# Patient Record
Sex: Male | Born: 1955 | Race: White | Hispanic: No | Marital: Single | Smoking: Never smoker
Health system: Southern US, Community
[De-identification: ages and names within clinical notes are randomized; demographics above are authoritative.]

## PROBLEM LIST (undated history)

## (undated) HISTORY — PX: BICEPS TENDON REPAIR: SHX566

## (undated) HISTORY — PX: ANTERIOR CRUCIATE LIGAMENT REPAIR: SHX115

---

## 2007-05-19 ENCOUNTER — Emergency Department (HOSPITAL_COMMUNITY): Admission: EM | Admit: 2007-05-19 | Discharge: 2007-05-19 | Payer: Self-pay | Admitting: Emergency Medicine

## 2009-01-26 ENCOUNTER — Emergency Department (HOSPITAL_COMMUNITY): Admission: EM | Admit: 2009-01-26 | Discharge: 2009-01-26 | Payer: Self-pay | Admitting: Emergency Medicine

## 2010-12-27 LAB — BASIC METABOLIC PANEL
BUN: 8 mg/dL (ref 6–23)
Chloride: 105 mEq/L (ref 96–112)
Glucose, Bld: 120 mg/dL — ABNORMAL HIGH (ref 70–99)
Potassium: 3.6 mEq/L (ref 3.5–5.1)

## 2010-12-27 LAB — DIFFERENTIAL
Eosinophils Absolute: 0.2 10*3/uL (ref 0.0–0.7)
Eosinophils Relative: 2 % (ref 0–5)
Lymphs Abs: 2.9 10*3/uL (ref 0.7–4.0)
Monocytes Absolute: 0.7 10*3/uL (ref 0.1–1.0)

## 2010-12-27 LAB — POCT CARDIAC MARKERS

## 2010-12-27 LAB — CBC
HCT: 46.8 % (ref 39.0–52.0)
MCV: 90.3 fL (ref 78.0–100.0)
Platelets: 269 10*3/uL (ref 150–400)
WBC: 9.4 10*3/uL (ref 4.0–10.5)

## 2013-02-25 ENCOUNTER — Other Ambulatory Visit: Payer: Self-pay | Admitting: Orthopedic Surgery

## 2013-02-25 DIAGNOSIS — M25512 Pain in left shoulder: Secondary | ICD-10-CM

## 2013-03-06 ENCOUNTER — Ambulatory Visit
Admission: RE | Admit: 2013-03-06 | Discharge: 2013-03-06 | Disposition: A | Discharge status: Home / Self Care | Payer: 59 | Source: Ambulatory Visit | Attending: Orthopedic Surgery | Admitting: Orthopedic Surgery

## 2013-03-06 DIAGNOSIS — M25512 Pain in left shoulder: Secondary | ICD-10-CM

## 2013-04-22 ENCOUNTER — Ambulatory Visit (HOSPITAL_COMMUNITY)
Admission: RE | Admit: 2013-04-22 | Discharge: 2013-04-22 | Disposition: A | Discharge status: Home / Self Care | Payer: 59 | Source: Ambulatory Visit | Attending: Orthopedic Surgery | Admitting: Orthopedic Surgery

## 2013-04-22 DIAGNOSIS — IMO0001 Reserved for inherently not codable concepts without codable children: Secondary | ICD-10-CM | POA: Insufficient documentation

## 2013-04-22 DIAGNOSIS — M25529 Pain in unspecified elbow: Secondary | ICD-10-CM | POA: Insufficient documentation

## 2013-04-22 DIAGNOSIS — M6281 Muscle weakness (generalized): Secondary | ICD-10-CM | POA: Insufficient documentation

## 2013-04-22 DIAGNOSIS — M25519 Pain in unspecified shoulder: Secondary | ICD-10-CM | POA: Insufficient documentation

## 2013-04-22 DIAGNOSIS — S46119A Strain of muscle, fascia and tendon of long head of biceps, unspecified arm, initial encounter: Secondary | ICD-10-CM | POA: Insufficient documentation

## 2013-04-22 NOTE — Evaluation (Signed)
Occupational Therapy Evaluation  Patient Details  Name: Riley Franco MRN: [id number] Date of Birth: [birth year]  Today's Date: 04/22/2013 Time: 4098-1191 OT Time Calculation (min): 45 min OT Evaluation [phone] 20' Manual Therapy [phone] 25' Visit#: 1 of 12  Re-eval: 05/20/13  Assessment Diagnosis: S/P Left Shoulder SAD/DCE and Biceps Tenodesis Surgical Date: 04/10/13 Next MD Visit: 05/11/13 Prior Therapy: n/a  Past Medical History: No past medical history on file. Past Surgical History: No past surgical history on file.  Subjective S:  I could move it really good the day after surgery.  It seems to get tighter every day.  I am worried I can't get my arm back behind my body.  Pertinent History: Riley Franco was removing a suitcase from his truck and felt his biceps tendon pop.  He continued to work for a few days, and was then sent home due to not being able to fully use his left arm.  He consulted with Dr. Sherlean Foot, had an MRI, and a complete biceps rupture was detected.  He had surgery on 04/10/13 for Left SAD/DCe and biceps tenodesis.  He has been referred by Dr. Sherlean Foot for evaluation and treatment. Limitations: Biceps Tenodesis Protocol scanned into computer:  Throug 8/14:  PROM only of elbow and shoulder.  8/14--8/28 progress to AAROM and AROM of shoudler and elbow as tolerated .  8/28 and beyond, begin strengthening if AROM is WNL. Special Tests: DASH score 59 with ideal score being 0. Patient Stated Goals: I want to have pain free mobility.  Pain Assessment Currently in Pain?: Yes Pain Score: 4  Pain Location: Shoulder Pain Orientation: Left Pain Type: Acute pain  Precautions/Restrictions  Precautions Precautions: Shoulder Restrictions Weight Bearing Restrictions: No  Balance Screening Balance Screen Has the patient fallen in the past 6 months: No  Prior Functioning  Home Living Family/patient expects to be discharged to:: Private residence Living Arrangements:  Alone Prior Function Level of Independence: Independent with basic ADLs  Able to Take Stairs?: Yes Driving: Yes Vocation: Full time employment Vocation Requirements: Government social research officer Leisure: Hobbies-yes (Comment) Comments: enjoys spending time with his grandchild  Assessment ADL/Vision/Perception ADL ADL Comments: unable to reach behind his back or head with his left arm.  Unable to lift anything heavy.  Unable to complete necessary work duties.  Dominant Hand: Right Vision - History Baseline Vision: No visual deficits  Cognition/Observation Cognition Overall Cognitive Status: Within Functional Limits for tasks assessed Observation/Other Assessments Observations: 3 scope healed incisions.  2 1 inch healing surgical incisions with scabbing at proximal ends  Sensation/Coordination/Edema Sensation Light Touch: Appears Intact Coordination Gross Motor Movements are Fluid and Coordinated: Yes Fine Motor Movements are Fluid and Coordinated: Yes  Additional Assessments LUE AROM (degrees) LUE Overall AROM Comments: assessed in supine ER/IR assessed in adducted position Left Shoulder Flexion: 150 Degrees Left Shoulder ABduction: 160 Degrees Left Shoulder Internal Rotation: 90 Degrees Left Shoulder External Rotation: 40 Degrees Left Elbow Flexion: 135 Left Elbow Extension: 0 LUE PROM (degrees) LUE Overall PROM Comments: assessed in supine ER/IR with shoulder adducted Left Shoulder Flexion: 150 Degrees Left Shoulder ABduction: 160 Degrees Left Shoulder Internal Rotation: 90 Degrees Left Shoulder External Rotation: 40 Degrees Left Elbow Flexion: 135 Left Elbow Extension: 0 LUE Strength LUE Overall Strength Comments: not assessed due to recent surgery Palpation Palpation: moderate-max fascial restrictions in his scapular region, biceps region, and upper  arm/shoulder region      Exercise/Treatments    Manual Therapy Manual Therapy: Myofascial release Myofascial  Release: MFR and  manual stretching to left upper arm, shoulder, biceps, and scapular region to decrease pain and fascial restrictions and increase pain free mobility in his left shoulder and elbow.  Occupational Therapy Assessment and Plan OT Assessment and Plan Clinical Impression Statement: A:  57 year old male presents with increased pain and fascial restrictions and decreased AROM and strength in left shoulder and elbow causing decreased ability to complete daily activities, work Nurse, learning disability, and leisure activities. Pt will benefit from skilled therapeutic intervention in order to improve on the following deficits: Decreased range of motion;Decreased strength;Increased fascial restricitons;Increased muscle spasms;Pain Rehab Potential: Excellent OT Frequency: Min 1X/week OT Duration: 6 weeks OT Treatment/Interventions: Self-care/ADL training;Therapeutic exercise;Manual therapy;Modalities;Therapeutic activities;Patient/family education OT Plan: P:  Skilled OT intervention to decrease pain and fascial restrictions and increase pain free mobiilty and strength in left shoulder and elbow region, following protocol.  Treatment Plan:  MFR and manual stretching to left shoulder and elbow region, scar release.  PROM of shoulder and elbow, pulleys, AROM shoulder ext, elev, row, ball stretches, isometric strengthening of shoulder and elbow.    Goals Short Term Goals Time to Complete Short Term Goals: 3 weeks Short Term Goal 1: Patient will be educated on HEP. Short Term Goal 2: Patient will increase PROM in his left shoulder and elbow to [facility] for increased ability to reach overhead and behind his back. Short Term Goal 3: Patient will increase strength to 4/5 for increased abilty to complete work activities.  Short Term Goal 4: Patient will decrease fascial restrictions to min-mod in his left shoulder and elbow region. Short Term Goal 5: Patient will decrease pain to 2/10 in his left arm during functional  activities.  Long Term Goals Time to Complete Long Term Goals: 6 weeks Long Term Goal 1: Patient will return to prior level of independence with all B/IADLs, work, and leisure activities.  Long Term Goal 2: Patient will increase left shoulder and elbow AROM to WNL for increased ability to don his belt and pull his pants up with his left hand. Long Term Goal 3: Patient will increase left shoulder strength to 5/5 for increased ability to return to full duties at work.  Long Term Goal 4: Patient will decrease fascial restrictions to trace in left elbow and shoulder region.  Long Term Goal 5: Patient will decrease pain to 1/10 in his left shoulder and elbow region.   Problem List Patient Active Problem List   Diagnosis Date Noted  . Biceps tendon rupture, proximal 04/22/2013  . Pain in joint, shoulder region 04/22/2013  . Pain in joint, upper arm 04/22/2013  . Muscle weakness (generalized) 04/22/2013    End of Session Activity Tolerance: Patient tolerated treatment well General Behavior During Therapy: [facility] for tasks assessed/performed OT Plan of Care OT Home Exercise Plan: towel slides   GO    Shirlean Mylar, OTR/L  04/22/2013, 12:53 PM  Physician Documentation Your signature is required to indicate approval of the treatment plan as stated above.  Please sign and either send electronically or make a copy of this report for your files and return this physician signed original.  Please mark one 1.__approve of plan  2. ___approve of plan with the following conditions.   ______________________________  _____________________ Physician Signature                                                                                                             Date

## 2013-04-30 ENCOUNTER — Ambulatory Visit (HOSPITAL_COMMUNITY)
Admission: RE | Admit: 2013-04-30 | Discharge: 2013-04-30 | Disposition: A | Discharge status: Home / Self Care | Payer: 59 | Source: Ambulatory Visit | Attending: Orthopedic Surgery | Admitting: Orthopedic Surgery

## 2013-04-30 DIAGNOSIS — M25522 Pain in left elbow: Secondary | ICD-10-CM

## 2013-04-30 DIAGNOSIS — M25512 Pain in left shoulder: Secondary | ICD-10-CM

## 2013-04-30 DIAGNOSIS — S46212A Strain of muscle, fascia and tendon of other parts of biceps, left arm, initial encounter: Secondary | ICD-10-CM

## 2013-04-30 DIAGNOSIS — M6281 Muscle weakness (generalized): Secondary | ICD-10-CM

## 2013-04-30 NOTE — Progress Notes (Signed)
Occupational Therapy Treatment Patient Details  Name: Riley Franco MRN: [id number] Date of Birth: 06-06-1956  Today's Date: 04/30/2013 Time: 2440-1027 OT Time Calculation (min): 34 min Manual Therapy [phone] 15' Therapeutic exercises 903-922 19' Visit#: 2 of 12  Re-eval: 05/20/13    Subjective S:  It feels a little looser, and it is still painful. Limitations: Biceps Tenodesis Protocol scanned into computer:  Throug 8/14:  PROM only of elbow and shoulder.  8/14--8/28 progress to AAROM and AROM of shoudler and elbow as tolerated .  8/28 and beyond, begin strengthening if AROM is WNL. Pain Assessment Currently in Pain?: Yes Pain Score: 2  Pain Location: Shoulder Pain Orientation: Left Pain Type: Acute pain  Precautions/Restrictions    Biceps Tenodesis Protocol scanned into computer:  Throug 8/14:  PROM only of elbow and shoulder.  8/14--8/28 progress to AAROM and AROM of shoudler and elbow as tolerated .  8/28 and beyond, begin strengthening if AROM is WNL.   Exercise/Treatments Supine Protraction: PROM;AAROM;10 reps Horizontal ABduction: PROM;AAROM;10 reps External Rotation: PROM;AAROM;10 reps Internal Rotation: PROM;AAROM;10 reps Flexion: PROM;AAROM;10 reps ABduction: PROM;10 reps ABduction Limitations: hold AAROM as patient is experiencing increased pain  Seated Elevation: AROM;10 reps Extension: AROM;10 reps Row: AROM;10 reps Pulleys Flexion: 1 minute ABduction: 1 minute Therapy Ball Flexion: 15 reps ABduction: 15 reps ROM / Strengthening / Isometric Strengthening Thumb Tacks: 1' Prot/Ret//Elev/Dep: 1'   Elbow Exercises Elbow Flexion: PROM;AAROM;10 reps;Supine Elbow Extension: PROM;AAROM;10 reps;Supine Forearm Supination: AROM;10 reps Forearm Pronation: AROM;10 reps Wrist Flexion: AROM;10 reps Wrist Extension: AROM;10 reps         Manual Therapy Manual Therapy: Myofascial release Myofascial Release: MFR and manual stretching to left upper arm,  shoulder, biceps, and scapular region to decrease pain and fascial restrictions and increase pain free mobility in his left shoulder and elbow.  Occupational Therapy Assessment and Plan OT Assessment and Plan Clinical Impression Statement: A:  Patiient requires constant cuing to depress shoulder blade during all therapeutic exercises.   OT Plan: P:  Increase body awareness/independence with proper positioning during exercises.  Attempt AAROM abduction.    Goals Short Term Goals Time to Complete Short Term Goals: 3 weeks Short Term Goal 1: Patient will be educated on HEP. Short Term Goal 1 Progress: Progressing toward goal Short Term Goal 2: Patient will increase PROM in his left shoulder and elbow to [facility] for increased ability to reach overhead and behind his back. Short Term Goal 2 Progress: Progressing toward goal Short Term Goal 3: Patient will increase strength to 4/5 for increased abilty to complete work activities.  Short Term Goal 3 Progress: Progressing toward goal Short Term Goal 4: Patient will decrease fascial restrictions to min-mod in his left shoulder and elbow region. Short Term Goal 4 Progress: Progressing toward goal Short Term Goal 5: Patient will decrease pain to 2/10 in his left arm during functional activities.  Short Term Goal 5 Progress: Progressing toward goal Long Term Goals Time to Complete Long Term Goals: 6 weeks Long Term Goal 1: Patient will return to prior level of independence with all B/IADLs, work, and leisure activities.  Long Term Goal 1 Progress: Progressing toward goal Long Term Goal 2: Patient will increase left shoulder and elbow AROM to WNL for increased ability to don his belt and pull his pants up with his left hand. Long Term Goal 2 Progress: Progressing toward goal Long Term Goal 3: Patient will increase left shoulder strength to 5/5 for increased ability to return to full duties at  work.  Long Term Goal 3 Progress: Progressing toward goal Long  Term Goal 4: Patient will decrease fascial restrictions to trace in left elbow and shoulder region.  Long Term Goal 4 Progress: Progressing toward goal Long Term Goal 5: Patient will decrease pain to 1/10 in his left shoulder and elbow region.  Long Term Goal 5 Progress: Progressing toward goal  Problem List Patient Active Problem List   Diagnosis Date Noted  . Biceps tendon rupture, proximal 04/22/2013  . Pain in joint, shoulder region 04/22/2013  . Pain in joint, upper arm 04/22/2013  . Muscle weakness (generalized) 04/22/2013    End of Session Activity Tolerance: Patient tolerated treatment well General Behavior During Therapy: [facility] for tasks assessed/performed  GO    Shirlean Mylar, OTR/L  04/30/2013, 9:24 AM

## 2013-05-06 ENCOUNTER — Ambulatory Visit (HOSPITAL_COMMUNITY)
Admission: RE | Admit: 2013-05-06 | Discharge: 2013-05-06 | Disposition: A | Discharge status: Home / Self Care | Payer: 59 | Source: Ambulatory Visit | Attending: Orthopedic Surgery | Admitting: Orthopedic Surgery

## 2013-05-06 DIAGNOSIS — S46212A Strain of muscle, fascia and tendon of other parts of biceps, left arm, initial encounter: Secondary | ICD-10-CM

## 2013-05-06 DIAGNOSIS — M25512 Pain in left shoulder: Secondary | ICD-10-CM

## 2013-05-06 DIAGNOSIS — M25522 Pain in left elbow: Secondary | ICD-10-CM

## 2013-05-06 DIAGNOSIS — M6281 Muscle weakness (generalized): Secondary | ICD-10-CM

## 2013-05-06 NOTE — Progress Notes (Signed)
Occupational Therapy Treatment Patient Details  Name: Riley Franco MRN: [id number] Date of Birth: March 30, 1956  Today's Date: 05/06/2013 Time: 1110-1147 OT Time Calculation (min): 37 min Manual Therapy 4098-1191 15' Therapeutic exercises 1125-1147 22' Visit#: 3 of 12  Re-eval: 05/20/13    Subjective S:  I have plenty of movement it just burns alot and I am having more pain than I thought I would at this point.  Limitations: Biceps Tenodesis Protocol scanned into computer:  Throug 8/14:  PROM only of elbow and shoulder.  8/14--8/28 progress to AAROM and AROM of shoudler and elbow as tolerated .  8/28 and beyond, begin strengthening if AROM is WNL. Pain Assessment Currently in Pain?: Yes Pain Score: 2  Pain Location: Shoulder Pain Orientation: Left Pain Type: Acute pain  Precautions/Restrictions    Biceps Tenodesis Protocol scanned into computer:  Throug 8/14:  PROM only of elbow and shoulder.  8/14--8/28 progress to AAROM and AROM of shoudler and elbow as tolerated .  8/28 and beyond, begin strengthening if AROM is WNL.   Exercise/Treatments Supine Protraction: PROM;10 reps;AAROM;12 reps Horizontal ABduction: PROM;10 reps;AAROM;12 reps External Rotation: PROM;10 reps;AAROM;12 reps Internal Rotation: PROM;10 reps;AAROM;12 reps Flexion: PROM;10 reps;AAROM;12 reps ABduction: PROM;10 reps;AAROM;12 reps Other Supine Exercises: AAROM elbow flexion and extension 15 X Seated Protraction: AAROM;10 reps Horizontal ABduction: AAROM;10 reps External Rotation: AAROM;10 reps Internal Rotation: AAROM;10 reps Flexion: AAROM;10 reps Abduction: AAROM;10 reps Other Seated Exercises: AAROM elbow flexion and extension X 10  Therapy Ball Flexion: 20 reps ABduction: 20 reps     Manual Therapy Manual Therapy: Myofascial release Myofascial Release: MFR and manual stretching to left upper arm, shoulder, biceps, and scapular region to decrease pain and fascial restrictions and increase pain  free mobility in his left shoulder and elbow.  Occupational Therapy Assessment and Plan OT Assessment and Plan Clinical Impression Statement: A:  Able to complete AAROM abduction in supine this date without difficulty.  AAROM in supine is WFL, therefore began AAROM in seated.  OT Plan: P:  Reassess for MD visit, begin AROM in supine and seated as tolerated.    Goals Short Term Goals Time to Complete Short Term Goals: 3 weeks Short Term Goal 1: Patient will be educated on HEP. Short Term Goal 1 Progress: Progressing toward goal Short Term Goal 2: Patient will increase PROM in his left shoulder and elbow to [facility] for increased ability to reach overhead and behind his back. Short Term Goal 2 Progress: Progressing toward goal Short Term Goal 3: Patient will increase strength to 4/5 for increased abilty to complete work activities.  Short Term Goal 3 Progress: Progressing toward goal Short Term Goal 4: Patient will decrease fascial restrictions to min-mod in his left shoulder and elbow region. Short Term Goal 4 Progress: Progressing toward goal Short Term Goal 5: Patient will decrease pain to 2/10 in his left arm during functional activities.  Short Term Goal 5 Progress: Progressing toward goal Long Term Goals Time to Complete Long Term Goals: 6 weeks Long Term Goal 1: Patient will return to prior level of independence with all B/IADLs, work, and leisure activities.  Long Term Goal 1 Progress: Progressing toward goal Long Term Goal 2: Patient will increase left shoulder and elbow AROM to WNL for increased ability to don his belt and pull his pants up with his left hand. Long Term Goal 2 Progress: Progressing toward goal Long Term Goal 3: Patient will increase left shoulder strength to 5/5 for increased ability to return to full duties  at work.  Long Term Goal 3 Progress: Progressing toward goal Long Term Goal 4: Patient will decrease fascial restrictions to trace in left elbow and shoulder  region.  Long Term Goal 4 Progress: Progressing toward goal Long Term Goal 5: Patient will decrease pain to 1/10 in his left shoulder and elbow region.  Long Term Goal 5 Progress: Progressing toward goal  Problem List Patient Active Problem List   Diagnosis Date Noted  . Biceps tendon rupture, proximal 04/22/2013  . Pain in joint, shoulder region 04/22/2013  . Pain in joint, upper arm 04/22/2013  . Muscle weakness (generalized) 04/22/2013    End of Session Activity Tolerance: Patient tolerated treatment well OT Plan of Care OT Home Exercise Plan: AAROM in supine and seated for shoulder and elbow  Consulted and Agree with Plan of Care: Patient  GO    Shirlean Mylar, OTR/L  05/06/2013,

## 2013-05-12 ENCOUNTER — Ambulatory Visit (HOSPITAL_COMMUNITY)
Admission: RE | Admit: 2013-05-12 | Discharge: 2013-05-12 | Disposition: A | Discharge status: Home / Self Care | Payer: 59 | Source: Ambulatory Visit | Attending: Orthopedic Surgery | Admitting: Orthopedic Surgery

## 2013-05-12 DIAGNOSIS — M25512 Pain in left shoulder: Secondary | ICD-10-CM

## 2013-05-12 DIAGNOSIS — M25522 Pain in left elbow: Secondary | ICD-10-CM

## 2013-05-12 DIAGNOSIS — S46212A Strain of muscle, fascia and tendon of other parts of biceps, left arm, initial encounter: Secondary | ICD-10-CM

## 2013-05-12 DIAGNOSIS — M6281 Muscle weakness (generalized): Secondary | ICD-10-CM

## 2013-05-12 NOTE — Evaluation (Signed)
Occupational Therapy Evaluation  Patient Details  Name: Riley Franco MRN: [id number] Date of Birth: Oct 29, 1955  Today's Date: 05/12/2013 Time: 7829-5621 OT Time Calculation (min): 46 min Manual therapy 308-657 13' ROM 846-962 Therapeutic exercises 952-841 22' Visit#: 4 of 12  Re-eval: 06/09/13     Authorization:    Authorization Time Period:    Authorization Visit#:   of     Past Medical History: No past medical history on file. Past Surgical History: No past surgical history on file.  Subjective S:  It mostly hurts in the morning and evening.  During the day it is fine.  Limitations: Biceps Tenodesis Protocol scanned into computer:  Throug 8/14:  PROM only of elbow and shoulder.  8/14--8/28 progress to AAROM and AROM of shoudler and elbow as tolerated .  8/28 and beyond, begin strengthening if AROM is WNL. Special Tests: DASH was 59 and is currently 38 with ideal score being 0. Pain Assessment Currently in Pain?: Yes Pain Score: 1  Pain Location: Shoulder Pain Orientation: Left Pain Type: Acute pain  Additional Assessments LUE AROM (degrees) LUE Overall AROM Comments: assessed in supine ER/IR assessed in adducted position (initial evaluation supine AROM) Left Shoulder Flexion: 166 Degrees ((150)) Left Shoulder ABduction: 170 Degrees ((160)) Left Shoulder Internal Rotation: 90 Degrees ((90)) Left Shoulder External Rotation: 45 Degrees ((40)) Left Elbow Flexion: 136 ((135)) Left Elbow Extension: 0 ((0))     Exercise/Treatments Supine Protraction: PROM;AROM;10 reps Horizontal ABduction: PROM;AROM;10 reps External Rotation: PROM;AROM;10 reps Internal Rotation: PROM;AROM;10 reps Flexion: PROM;AROM;10 reps ABduction: PROM;AROM;10 reps Seated Elevation: AROM;15 reps Extension: AROM;15 reps Retraction: AROM;15 reps Row: AROM;15 reps Protraction: AROM;10 reps Horizontal ABduction: AROM;10 reps External Rotation: AROM;10 reps Internal Rotation: AROM;10  reps Flexion: AROM;10 reps Abduction: AROM;10 reps Therapy Ball Right/Left: 5 reps ROM / Strengthening / Isometric Strengthening UBE (Upper Arm Bike): 2' firward abd 2; reverse 1.0 Wall Wash: 2' Proximal Shoulder Strengthening, Supine: 10 X each Proximal Shoulder Strengthening, Seated: 10 X each       Manual Therapy Manual Therapy: Myofascial release Myofascial Release: MFR and manual stretching to left upper arm, shoulder, biceps, and scapular region to decrease pain and fascial restrictions and increase pain free mobility in his left shoulder and elbow.  Occupational Therapy Assessment and Plan OT Assessment and Plan Clinical Impression Statement: A: Patient has met 4/5 short term goals and is progressing towards remaining short term goal and long term goals. He has increased independence with reaching to shoulder height, behind his head, and back.  He has not returned to work, and has not lifted anything heavy.  OT Frequency: Min 1X/week OT Duration: 6 weeks OT Plan: P:  Begin strengthening exercises.   Goals Short Term Goals Time to Complete Short Term Goals: 3 weeks Short Term Goal 1: Patient will be educated on HEP. Short Term Goal 1 Progress: Met Short Term Goal 2: Patient will increase PROM in his left shoulder and elbow to [facility] for increased ability to reach overhead and behind his back. Short Term Goal 2 Progress: Met Short Term Goal 3: Patient will increase strength to 4/5 for increased abilty to complete work activities.  Short Term Goal 3 Progress: Progressing toward goal Short Term Goal 4: Patient will decrease fascial restrictions to min-mod in his left shoulder and elbow region. Short Term Goal 4 Progress: Met Short Term Goal 5: Patient will decrease pain to 2/10 in his left arm during functional activities.  Short Term Goal 5 Progress: Met Long Term Goals Time  to Complete Long Term Goals: 6 weeks Long Term Goal 1: Patient will return to prior level of  independence with all B/IADLs, work, and leisure activities.  Long Term Goal 1 Progress: Progressing toward goal Long Term Goal 2: Patient will increase left shoulder and elbow AROM to WNL for increased ability to don his belt and pull his pants up with his left hand. Long Term Goal 2 Progress: Progressing toward goal Long Term Goal 3: Patient will increase left shoulder strength to 5/5 for increased ability to return to full duties at work.  Long Term Goal 3 Progress: Progressing toward goal Long Term Goal 4: Patient will decrease fascial restrictions to trace in left elbow and shoulder region.  Long Term Goal 4 Progress: Progressing toward goal Long Term Goal 5: Patient will decrease pain to 1/10 in his left shoulder and elbow region.  Long Term Goal 5 Progress: Progressing toward goal  Problem List Patient Active Problem List   Diagnosis Date Noted  . Biceps tendon rupture, proximal 04/22/2013  . Pain in joint, shoulder region 04/22/2013  . Pain in joint, upper arm 04/22/2013  . Muscle weakness (generalized) 04/22/2013    End of Session Activity Tolerance: Patient tolerated treatment well General Behavior During Therapy: [facility] for tasks assessed/performed  GO    Shirlean Mylar, OTR/L  05/12/2013, 10:02 AM  Physician Documentation Your signature is required to indicate approval of the treatment plan as stated above.  Please sign and either send electronically or make a copy of this report for your files and return this physician signed original.  Please mark one 1.__approve of plan  2. ___approve of plan with the following conditions.   ______________________________                                                          _____________________ Physician Signature                                                                                                             Date

## 2013-05-13 ENCOUNTER — Ambulatory Visit (HOSPITAL_COMMUNITY): Payer: 59 | Admitting: Specialist

## 2013-05-14 ENCOUNTER — Ambulatory Visit (HOSPITAL_COMMUNITY): Payer: 59

## 2013-05-20 ENCOUNTER — Ambulatory Visit (HOSPITAL_COMMUNITY)
Admission: RE | Admit: 2013-05-20 | Discharge: 2013-05-20 | Disposition: A | Discharge status: Home / Self Care | Payer: 59 | Source: Ambulatory Visit | Attending: Orthopedic Surgery | Admitting: Orthopedic Surgery

## 2013-05-20 DIAGNOSIS — M25512 Pain in left shoulder: Secondary | ICD-10-CM

## 2013-05-20 DIAGNOSIS — M25519 Pain in unspecified shoulder: Secondary | ICD-10-CM | POA: Insufficient documentation

## 2013-05-20 DIAGNOSIS — IMO0001 Reserved for inherently not codable concepts without codable children: Secondary | ICD-10-CM | POA: Insufficient documentation

## 2013-05-20 DIAGNOSIS — S46212D Strain of muscle, fascia and tendon of other parts of biceps, left arm, subsequent encounter: Secondary | ICD-10-CM

## 2013-05-20 DIAGNOSIS — M25522 Pain in left elbow: Secondary | ICD-10-CM

## 2013-05-20 DIAGNOSIS — M6281 Muscle weakness (generalized): Secondary | ICD-10-CM

## 2013-05-20 NOTE — Progress Notes (Signed)
Occupational Therapy Treatment Patient Details  Name: Riley Franco MRN: [id number] Date of Birth: Jan 17, 1956  Today's Date: 05/20/2013 Time: 5621-3086 OT Time Calculation (min): 43 min Manual Therapy [phone] 12' Therapeutic exercises 819-850 31' Visit#: 5 of 12  Re-eval: 06/09/13     Subjective S:  I went to Dr. Sherlean Foot last week and he said I was doing well and to continue therapy.  I go back in about a month. Limitations: Biceps Tenodesis Protocol scanned into computer:  Throug 8/14:  PROM only of elbow and shoulder.  8/14--8/28 progress to AAROM and AROM of shoudler and elbow as tolerated .  8/28 and beyond, begin strengthening if AROM is WNL. Repetition: Increases Symptoms Pain Assessment Pain Score: 1  Pain Location: Shoulder Pain Orientation: Left Pain Type: Acute pain  Precautions/Restrictions    8/28 and beyond, begin strengthening if AROM is WNL.   Exercise/Treatments Supine Protraction: PROM;Strengthening;10 reps Protraction Weight (lbs): 1 Horizontal ABduction: PROM;Strengthening;10 reps Horizontal ABduction Weight (lbs): 1 External Rotation: PROM;Strengthening;10 reps External Rotation Weight (lbs): 1 Internal Rotation: PROM;Strengthening;10 reps Internal Rotation Weight (lbs): 1 Flexion: PROM;Strengthening;10 reps Shoulder Flexion Weight (lbs): 1 ABduction: PROM;Strengthening;10 reps Shoulder ABduction Weight (lbs): 1 Seated Protraction: Strengthening;10 reps Protraction Weight (lbs): 1 Horizontal ABduction: Strengthening;10 reps Horizontal ABduction Weight (lbs): 1 External Rotation: Strengthening;10 reps External Rotation Weight (lbs): 1 Internal Rotation: Strengthening;10 reps Internal Rotation Weight (lbs): 1 Flexion: Strengthening;10 reps Flexion Weight (lbs): 1 Abduction: Strengthening;10 reps ABduction Weight (lbs): 1 Standing External Rotation: Theraband;10 reps Theraband Level (Shoulder External Rotation): Level 3 (Green) Internal  Rotation: Theraband;10 reps Theraband Level (Shoulder Internal Rotation): Level 3 (Green) Extension: Theraband;10 reps Theraband Level (Shoulder Extension): Level 3 (Green) Row: Theraband;10 reps Theraband Level (Shoulder Row): Level 3 (Green) Retraction: Theraband;10 reps Theraband Level (Shoulder Retraction): Level 3 (Green) Other Standing Exercises: green theraband for bicep curl and tricep extension 10 times each Therapy Ball Right/Left: 5 reps ROM / Strengthening / Isometric Strengthening UBE (Upper Arm Bike): 3' forward and 3' reverse at 1/5 "W" Arms: 10X X to V Arms: 10X Proximal Shoulder Strengthening, Supine: 10X with 1# Proximal Shoulder Strengthening, Seated: 10 X with 1# Ball on Wall: 1' with arm flexed to 90 and 1' with armd abducted to 90      Manual Therapy Manual Therapy: Myofascial release Myofascial Release: MFR and manual stretching to left upper arm, shoulder, biceps, and scapular region to decrease pain and fascial restrictions and increase pain free mobility in his left shoulder and elbow.  Occupational Therapy Assessment and Plan OT Assessment and Plan Clinical Impression Statement: A:  Per protocol, added strengthening with 1# resistance in supine and seated.  Added strengthening to HEP. OT Plan: P:  Increase strengthening reps, add overhead lace and cybex press and row.   Goals Short Term Goals Time to Complete Short Term Goals: 3 weeks Short Term Goal 1: Patient will be educated on HEP. Short Term Goal 2: Patient will increase PROM in his left shoulder and elbow to [facility] for increased ability to reach overhead and behind his back. Short Term Goal 3: Patient will increase strength to 4/5 for increased abilty to complete work activities.  Short Term Goal 3 Progress: Progressing toward goal Short Term Goal 4: Patient will decrease fascial restrictions to min-mod in his left shoulder and elbow region. Short Term Goal 5: Patient will decrease pain to 2/10 in  his left arm during functional activities.  Long Term Goals Time to Complete Long Term Goals: 6 weeks Long Term Goal  1: Patient will return to prior level of independence with all B/IADLs, work, and leisure activities.  Long Term Goal 1 Progress: Progressing toward goal Long Term Goal 2: Patient will increase left shoulder and elbow AROM to WNL for increased ability to don his belt and pull his pants up with his left hand. Long Term Goal 2 Progress: Progressing toward goal Long Term Goal 3: Patient will increase left shoulder strength to 5/5 for increased ability to return to full duties at work.  Long Term Goal 3 Progress: Progressing toward goal Long Term Goal 4: Patient will decrease fascial restrictions to trace in left elbow and shoulder region.  Long Term Goal 4 Progress: Progressing toward goal Long Term Goal 5: Patient will decrease pain to 1/10 in his left shoulder and elbow region.  Long Term Goal 5 Progress: Progressing toward goal  Problem List Patient Active Problem List   Diagnosis Date Noted  . Biceps tendon rupture, proximal 04/22/2013  . Pain in joint, shoulder region 04/22/2013  . Pain in joint, upper arm 04/22/2013  . Muscle weakness (generalized) 04/22/2013    End of Session Activity Tolerance: Patient tolerated treatment well General Behavior During Therapy: [facility] for tasks assessed/performed OT Plan of Care OT Home Exercise Plan: shoulder and elbow strengthening with 1# OT Patient Instructions: scanned Consulted and Agree with Plan of Care: Patient  GO    Shirlean Mylar, OTR/L  05/20/2013, 8:53 AM

## 2013-05-27 ENCOUNTER — Ambulatory Visit (HOSPITAL_COMMUNITY)
Admission: RE | Admit: 2013-05-27 | Discharge: 2013-05-27 | Disposition: A | Discharge status: Home / Self Care | Payer: 59 | Source: Ambulatory Visit | Attending: Orthopedic Surgery | Admitting: Orthopedic Surgery

## 2013-05-27 DIAGNOSIS — S46212D Strain of muscle, fascia and tendon of other parts of biceps, left arm, subsequent encounter: Secondary | ICD-10-CM

## 2013-05-27 DIAGNOSIS — M6281 Muscle weakness (generalized): Secondary | ICD-10-CM

## 2013-05-27 DIAGNOSIS — M25512 Pain in left shoulder: Secondary | ICD-10-CM

## 2013-05-27 DIAGNOSIS — M25522 Pain in left elbow: Secondary | ICD-10-CM

## 2013-05-27 NOTE — Progress Notes (Signed)
Occupational Therapy Treatment Patient Details  Name: Riley Franco MRN: [id number] Date of Birth: 1956/05/29  Today's Date: 05/27/2013 Time: 4098-1191 OT Time Calculation (min): 40 min Manual Therapy [phone] 19' Therapeutic exercises 830-851 21'  Visit#: 6 of 12  Re-eval: 06/09/13    Authorization:    Authorization Time Period:    Authorization Visit#:   of    Subjective  S:  I think Im doing a lot better.  I think I have full movement. Limitations: Biceps Tenodesis Protocol scanned into computer:  Throug 8/14:  PROM only of elbow and shoulder.  8/14--8/28 progress to AAROM and AROM of shoudler and elbow as tolerated .  8/28 and beyond, begin strengthening if AROM is WNL. Pain Assessment Currently in Pain?: No/denies Pain Score: 0-No pain  Precautions/Restrictions    8/28 and beyond, begin strengthening if AROM is WNL.   Exercise/Treatments Supine Protraction: PROM;Strengthening;10 reps Protraction Weight (lbs): 2 Horizontal ABduction: PROM;Strengthening;10 reps Horizontal ABduction Weight (lbs): 2 External Rotation: PROM;Strengthening;10 reps External Rotation Weight (lbs): 2 Internal Rotation: PROM;Strengthening;10 reps Internal Rotation Weight (lbs): 2 Flexion: PROM;Strengthening;10 reps Shoulder Flexion Weight (lbs): 2 ABduction: PROM;Strengthening;10 reps Shoulder ABduction Weight (lbs): 2 Seated Protraction: Strengthening;10 reps Protraction Weight (lbs): 2 Horizontal ABduction: Strengthening;10 reps Horizontal ABduction Weight (lbs): 2 External Rotation: Strengthening;10 reps External Rotation Weight (lbs): 2 Internal Rotation: Strengthening;10 reps Internal Rotation Weight (lbs): 2 Flexion: Strengthening;10 reps Flexion Weight (lbs): 2 Abduction: Strengthening;10 reps ABduction Weight (lbs): 2 ROM / Strengthening / Isometric Strengthening UBE (Upper Arm Bike): 3' and 3' 2.0  Wall Wash: 2' with 2# "W" Arms: 10X with 2# X to V Arms: 10 X with  2# Proximal Shoulder Strengthening, Supine: 10X with 2# Proximal Shoulder Strengthening, Seated: 10X with 2# Ball on Wall: 1' with arm flexed to 90 and 1' with armd abducted to 90 with 2# weight on wrist      Manual Therapy Manual Therapy: Myofascial release Myofascial Release: MFR and manual stretching to left upper arm, shoulder, biceps, and scapular region to decrease pain and fascial restrictions and increase pain free mobility in his left shoulder and elbow.  Occupational Therapy Assessment and Plan OT Assessment and Plan Clinical Impression Statement: A:  Increased to 2# with all seated and supine strengthening exercises.   OT Plan: P:  Add overhead lace and cybex press and row.   Goals Short Term Goals Time to Complete Short Term Goals: 3 weeks Short Term Goal 1: Patient will be educated on HEP. Short Term Goal 2: Patient will increase PROM in his left shoulder and elbow to [facility] for increased ability to reach overhead and behind his back. Short Term Goal 3: Patient will increase strength to 4/5 for increased abilty to complete work activities.  Short Term Goal 3 Progress: Progressing toward goal Short Term Goal 4: Patient will decrease fascial restrictions to min-mod in his left shoulder and elbow region. Short Term Goal 5: Patient will decrease pain to 2/10 in his left arm during functional activities.  Long Term Goals Time to Complete Long Term Goals: 6 weeks Long Term Goal 1: Patient will return to prior level of independence with all B/IADLs, work, and leisure activities.  Long Term Goal 1 Progress: Progressing toward goal Long Term Goal 2: Patient will increase left shoulder and elbow AROM to WNL for increased ability to don his belt and pull his pants up with his left hand. Long Term Goal 2 Progress: Progressing toward goal Long Term Goal 3: Patient will increase left shoulder  strength to 5/5 for increased ability to return to full duties at work.  Long Term Goal 3  Progress: Progressing toward goal Long Term Goal 4: Patient will decrease fascial restrictions to trace in left elbow and shoulder region.  Long Term Goal 4 Progress: Progressing toward goal Long Term Goal 5: Patient will decrease pain to 1/10 in his left shoulder and elbow region.  Long Term Goal 5 Progress: Progressing toward goal  Problem List Patient Active Problem List   Diagnosis Date Noted  . Biceps tendon rupture, proximal 04/22/2013  . Pain in joint, shoulder region 04/22/2013  . Pain in joint, upper arm 04/22/2013  . Muscle weakness (generalized) 04/22/2013    End of Session Activity Tolerance: Patient tolerated treatment well General Behavior During Therapy: [facility] for tasks assessed/performed  GO    Shirlean Mylar, OTR/L  05/27/2013, 8:48 AM

## 2013-06-03 ENCOUNTER — Ambulatory Visit (HOSPITAL_COMMUNITY)
Admission: RE | Admit: 2013-06-03 | Discharge: 2013-06-03 | Disposition: A | Discharge status: Home / Self Care | Payer: 59 | Source: Ambulatory Visit | Attending: Orthopedic Surgery | Admitting: Orthopedic Surgery

## 2013-06-03 DIAGNOSIS — M25512 Pain in left shoulder: Secondary | ICD-10-CM

## 2013-06-03 DIAGNOSIS — M6281 Muscle weakness (generalized): Secondary | ICD-10-CM

## 2013-06-03 DIAGNOSIS — M25522 Pain in left elbow: Secondary | ICD-10-CM

## 2013-06-03 DIAGNOSIS — S46212D Strain of muscle, fascia and tendon of other parts of biceps, left arm, subsequent encounter: Secondary | ICD-10-CM

## 2013-06-03 NOTE — Progress Notes (Signed)
Occupational Therapy Treatment Patient Details  Name: Riley Franco MRN: [id number] Date of Birth: Jul 17, 1956  Today's Date: 06/03/2013 Time: 5284-1324 OT Time Calculation (min): 45 min Manual Therapy [phone] 16' Therapeutic Exercises 821-850 29' Visit#: 7 of 12  Re-eval: 06/09/13     Subjective S:  My shoulder feels great. Limitations: Biceps Tenodesis Protocol scanned into computer:  Throug 8/14:  PROM only of elbow and shoulder.  8/14--8/28 progress to AAROM and AROM of shoudler and elbow as tolerated .  8/28 and beyond, begin strengthening if AROM is WNL. Pain Assessment Currently in Pain?: No/denies Pain Score: 0-No pain  Precautions/Restrictions    Biceps Tenodesis Protocol scanned into computer:  Throug 8/14:  PROM only of elbow and shoulder.  8/14--8/28 progress to AAROM and AROM of shoudler and elbow as tolerated .  8/28 and beyond, begin strengthening if AROM is WNL.   Exercise/Treatments Supine Protraction: PROM;10 reps;Strengthening;15 reps Protraction Weight (lbs): 2 Horizontal ABduction: PROM;10 reps;Strengthening;15 reps Horizontal ABduction Weight (lbs): 2 External Rotation: PROM;10 reps;Strengthening;15 reps External Rotation Weight (lbs): 2 Internal Rotation: PROM;10 reps;Strengthening;15 reps Internal Rotation Weight (lbs): 2 Flexion: PROM;10 reps;Strengthening;15 reps Shoulder Flexion Weight (lbs): 2 ABduction: PROM;10 reps;Strengthening;15 reps Shoulder ABduction Weight (lbs): 2 Seated Protraction: Strengthening;15 reps Protraction Weight (lbs): 2 Horizontal ABduction: Strengthening;15 reps Horizontal ABduction Weight (lbs): 2 External Rotation: Strengthening;15 reps External Rotation Weight (lbs): 2 Internal Rotation: Strengthening;15 reps Internal Rotation Weight (lbs): 2 Flexion: Strengthening;15 reps Flexion Weight (lbs): 2 Abduction: Strengthening;15 reps ABduction Weight (lbs): 2 Therapy Ball Right/Left: 5 reps ROM / Strengthening /  Isometric Strengthening UBE (Upper Arm Bike): 3' and 3' 2.5 Cybex Press: 1.5 plate;15 reps Cybex Row: 1.5 plate;15 reps Over Head Lace: 2' with 2# "W" Arms: 10X with 2# X to V Arms: 10 X with 2# Proximal Shoulder Strengthening, Seated: 10X with 2# Ball on Wall: 1' with arm flexed to 90 and 1' with armd abducted to 90 with 2# weight on wrist      Manual Therapy Manual Therapy: Myofascial release Myofascial Release: MFR and manual stretching to left upper arm, shoulder, biceps, and scapular region to decrease pain and fascial restrictions and increase pain free mobility in his left shoulder and elbow.  Occupational Therapy Assessment and Plan OT Assessment and Plan Clinical Impression Statement: A:  Added cybex press/row and overhead lace.  Patient with full AROM in supine and seated with some pain in posterior upper arm.  OT Plan: P:  Reassess for MD visit and add prone exercises for scapular strengthening and stability.    Goals Short Term Goals Time to Complete Short Term Goals: 3 weeks Short Term Goal 1: Patient will be educated on HEP. Short Term Goal 2: Patient will increase PROM in his left shoulder and elbow to [facility] for increased ability to reach overhead and behind his back. Short Term Goal 3: Patient will increase strength to 4/5 for increased abilty to complete work activities.  Short Term Goal 4: Patient will decrease fascial restrictions to min-mod in his left shoulder and elbow region. Short Term Goal 5: Patient will decrease pain to 2/10 in his left arm during functional activities.  Long Term Goals Time to Complete Long Term Goals: 6 weeks Long Term Goal 1: Patient will return to prior level of independence with all B/IADLs, work, and leisure activities.  Long Term Goal 2: Patient will increase left shoulder and elbow AROM to WNL for increased ability to don his belt and pull his pants up with his left hand.  Long Term Goal 3: Patient will increase left shoulder strength  to 5/5 for increased ability to return to full duties at work.  Long Term Goal 4: Patient will decrease fascial restrictions to trace in left elbow and shoulder region.  Long Term Goal 5: Patient will decrease pain to 1/10 in his left shoulder and elbow region.   Problem List Patient Active Problem List   Diagnosis Date Noted  . Biceps tendon rupture, proximal 04/22/2013  . Pain in joint, shoulder region 04/22/2013  . Pain in joint, upper arm 04/22/2013  . Muscle weakness (generalized) 04/22/2013    End of Session Activity Tolerance: Patient tolerated treatment well General Behavior During Therapy: [facility] for tasks assessed/performed  GO    Shirlean Mylar, OTR/L  06/03/2013, 8:46 AM

## 2013-06-10 ENCOUNTER — Ambulatory Visit (HOSPITAL_COMMUNITY)
Admission: RE | Admit: 2013-06-10 | Discharge: 2013-06-10 | Disposition: A | Discharge status: Home / Self Care | Payer: 59 | Source: Ambulatory Visit | Attending: Orthopedic Surgery | Admitting: Orthopedic Surgery

## 2013-06-10 DIAGNOSIS — M25512 Pain in left shoulder: Secondary | ICD-10-CM

## 2013-06-10 DIAGNOSIS — S46212D Strain of muscle, fascia and tendon of other parts of biceps, left arm, subsequent encounter: Secondary | ICD-10-CM

## 2013-06-10 DIAGNOSIS — M6281 Muscle weakness (generalized): Secondary | ICD-10-CM

## 2013-06-10 DIAGNOSIS — M25522 Pain in left elbow: Secondary | ICD-10-CM

## 2013-06-10 NOTE — Progress Notes (Signed)
Occupational Therapy Treatment Patient Details  Name: Riley Franco MRN: [id number] Date of Birth: [birth year]  Today's Date: 06/10/2013 Time: 7846-9629 OT Time Calculation (min): 30 min Manual Therapy [phone] 10' Reassessment 244-010 20'  Visit#: 8 of 12  Re-eval: 06/09/13     Subjective  S:  Its feeling pretty good.  I go to the MD tomorrow. Limitations: Biceps Tenodesis Protocol scanned into computer:  Throug 8/14:  PROM only of elbow and shoulder.  8/14--8/28 progress to AAROM and AROM of shoudler and elbow as tolerated .  8/28 and beyond, begin strengthening if AROM is WNL. Special Tests: DASH was 38 and is currently 25 with ideal score being 0. Pain Assessment Currently in Pain?: No/denies Pain Score: 0-No pain   Exercise/Treatments    Manual Therapy Manual Therapy: Myofascial release Myofascial Release: MFR and manual stretching to left upper arm, shoulder, biceps, and scapular region to decrease pain and fascial restrictions and increase pain free mobility in his left shoulder and elbow.  Occupational Therapy Assessment and Plan OT Assessment and Plan Clinical Impression Statement: A:  AROM and strength in seated (05/12/13 in supine):  shoulder flexion 162 5/5 (166), abduction 160 5/5 (170), external rotation with shoulder abducted to 90 70 5/5 (shoulder adducted 45), internal rotaiton with shoulder abducted 74 5/5 (with shoulder adducted 90), elbow flexion 135 5/5 (136), elbow extension 0 5/5 (0).  Has returned to prior level of I with all B/IADLs, leisure activities.  Has not returned to work, awaiting MD clearance.   OT Plan: P:  DC from skilled OT intervention this date with HEP for continued strengthening of shoulder and elbow in order to return to work when MD allows.    Goals Short Term Goals Time to Complete Short Term Goals: 3 weeks Short Term Goal 1: Patient will be educated on HEP. Short Term Goal 2: Patient will increase PROM in his left shoulder and elbow to  [facility] for increased ability to reach overhead and behind his back. Short Term Goal 3: Patient will increase strength to 4/5 for increased abilty to complete work activities.  Short Term Goal 4: Patient will decrease fascial restrictions to min-mod in his left shoulder and elbow region. Short Term Goal 5: Patient will decrease pain to 2/10 in his left arm during functional activities.  Long Term Goals Time to Complete Long Term Goals: 6 weeks Long Term Goal 1: Patient will return to prior level of independence with all B/IADLs, work, and leisure activities.  Long Term Goal 1 Progress: Progressing toward goal Long Term Goal 2: Patient will increase left shoulder and elbow AROM to WNL for increased ability to don his belt and pull his pants up with his left hand. Long Term Goal 2 Progress: Met Long Term Goal 3: Patient will increase left shoulder strength to 5/5 for increased ability to return to full duties at work.  Long Term Goal 3 Progress: Met Long Term Goal 4: Patient will decrease fascial restrictions to trace in left elbow and shoulder region.  Long Term Goal 4 Progress: Met Long Term Goal 5: Patient will decrease pain to 1/10 in his left shoulder and elbow region.  Long Term Goal 5 Progress: Met  Problem List Patient Active Problem List   Diagnosis Date Noted  . Biceps tendon rupture, proximal 04/22/2013  . Pain in joint, shoulder region 04/22/2013  . Pain in joint, upper arm 04/22/2013  . Muscle weakness (generalized) 04/22/2013    End of Session Activity Tolerance: Patient tolerated treatment  well General Behavior During Therapy: [facility] for tasks assessed/performed  Shirlean Mylar, OTR/L  06/10/2013, 8:41 AM

## 2013-06-17 ENCOUNTER — Ambulatory Visit (HOSPITAL_COMMUNITY)
Admission: RE | Admit: 2013-06-17 | Discharge: 2013-06-17 | Disposition: A | Discharge status: Home / Self Care | Payer: 59 | Source: Ambulatory Visit | Attending: Orthopedic Surgery | Admitting: Orthopedic Surgery

## 2013-06-17 NOTE — Evaluation (Signed)
Occupational Therapy Re-Evaluation/Treatment  Patient Details  Name: Riley Franco MRN: [id number] Date of Birth: August 08, 1956  Today's Date: 06/17/2013 Time: 4098-1191 OT Time Calculation (min): 40 min OT eval [phone] 5' Therex 478-295 35'  Visit#: 1 of 1  Re-eval:    Assessment Diagnosis: S/P Left Shoulder SAD/DCE and Biceps Tenodesis  Authorization:    Authorization Time Period:    Authorization Visit#:   of     Past Medical History: No past medical history on file. Past Surgical History: No past surgical history on file.  Subjective Symptoms/Limitations Symptoms: S: Insurance wants me to continue with therapy until I return to work. I'm returning to work next week. Pertinent History: Mr. Can was discharged from therapy on 06/10/13 for S/P Left Shoulder SAD/DCE and Biceps Tenodesis. Patient presents today for a re-eval and review of HEP.  Special Tests: DASH was 38 and is currently 25 with ideal score being 0. Pain Assessment Currently in Pain?: No/denies  Precautions/Restrictions  Precautions Precautions: Shoulder   Assessment Additional Assessments LUE Assessment LUE Assessment: Within Functional Limits LUE Strength LUE Overall Strength Comments: Overall strength in LUE: 5/5     Exercise/Treatments Supine Protraction: Strengthening;15 reps Protraction Weight (lbs): 2 Horizontal ABduction: Strengthening;15 reps Horizontal ABduction Weight (lbs): 2 External Rotation: Strengthening;15 reps External Rotation Weight (lbs): 2 Internal Rotation: Strengthening;15 reps Internal Rotation Weight (lbs): 2 Flexion: Strengthening;15 reps Shoulder Flexion Weight (lbs): 2 ABduction: Strengthening;15 reps Shoulder ABduction Weight (lbs): 2 Seated Protraction: Strengthening;15 reps Protraction Weight (lbs): 2 Horizontal ABduction: Strengthening;15 reps Horizontal ABduction Weight (lbs): 2 External Rotation: Strengthening;15 reps External Rotation Weight (lbs):  2 Internal Rotation: Strengthening;15 reps Internal Rotation Weight (lbs): 2 Flexion: Strengthening;15 reps Flexion Weight (lbs): 2 Abduction: Strengthening;15 reps ABduction Weight (lbs): 2 ROM / Strengthening / Isometric Strengthening Cybex Press: 2 plate;15 reps Cybex Row: 2 plate;15 reps Wall Wash: 2' with 2# "W" Arms: 15X with 2# X to V Arms: 15X with 2# Proximal Shoulder Strengthening, Supine: 15X with 2# Proximal Shoulder Strengthening, Seated: 15X with 2# Ball on Wall: 1' with arm flexed to 90 and 1' with arm abducted to 90 with red weighted ball  Other ROM/Strengthening Exercises: red weighted ball; 5X  left/right   Occupational Therapy Assessment and Plan OT Assessment and Plan Clinical Impression Statement: A: Patient seen for a re-eval and review of HEP. Patient's AROM is WFL and MTM: 5/5. Patient was educated on use of green theraband for HEP and reviewed exercises.  OT Plan: P: 1 time visit. D/C from therapy.   Goals Short Term Goals Time to Complete Short Term Goals: 2 weeks Short Term Goal 1: Patient will be educated on HEP. Short Term Goal 1 Progress: Met  Problem List Patient Active Problem List   Diagnosis Date Noted  . Biceps tendon rupture, proximal 04/22/2013  . Pain in joint, shoulder region 04/22/2013  . Pain in joint, upper arm 04/22/2013  . Muscle weakness (generalized) 04/22/2013    End of Session Activity Tolerance: Patient tolerated treatment well General Behavior During Therapy: [facility] for tasks assessed/performed OT Plan of Care OT Home Exercise Plan: green theraband OT Patient Instructions: handout - scanned Consulted and Agree with Plan of Care: Patient   Limmie Patricia, OTR/L,CBIS   06/17/2013, 9:15 AM  Physician Documentation Your signature is required to indicate approval of the treatment plan as stated above.  Please sign and either send electronically or make a copy of this report for your files and return this physician signed  original.  Please mark  one 1.__approve of plan  2. ___approve of plan with the following conditions.   ______________________________                                                          _____________________ Physician Signature                                                                                                             Date

## 2013-06-24 ENCOUNTER — Ambulatory Visit (HOSPITAL_COMMUNITY): Payer: 59 | Admitting: Specialist

## 2016-02-07 ENCOUNTER — Emergency Department (HOSPITAL_COMMUNITY)
Admission: EM | Admit: 2016-02-07 | Discharge: 2016-02-07 | Disposition: A | Discharge status: Home / Self Care | Payer: 59 | Attending: Emergency Medicine | Admitting: Emergency Medicine

## 2016-02-07 ENCOUNTER — Encounter (HOSPITAL_COMMUNITY): Payer: Self-pay | Admitting: *Deleted

## 2016-02-07 ENCOUNTER — Emergency Department (HOSPITAL_COMMUNITY): Payer: 59

## 2016-02-07 DIAGNOSIS — M5432 Sciatica, left side: Secondary | ICD-10-CM

## 2016-02-07 DIAGNOSIS — Z79899 Other long term (current) drug therapy: Secondary | ICD-10-CM | POA: Diagnosis not present

## 2016-02-07 DIAGNOSIS — M5442 Lumbago with sciatica, left side: Secondary | ICD-10-CM | POA: Insufficient documentation

## 2016-02-07 DIAGNOSIS — M545 Low back pain: Secondary | ICD-10-CM | POA: Diagnosis present

## 2016-02-07 MED ORDER — HYDROCODONE-ACETAMINOPHEN 5-325 MG PO TABS
1.0000 | ORAL_TABLET | Freq: Once | ORAL | Status: AC
  Administered 2016-02-07: 1 via ORAL
  Filled 2016-02-07: qty 1

## 2016-02-07 MED ORDER — HYDROCODONE-ACETAMINOPHEN 5-325 MG PO TABS: 1.0000 | ORAL_TABLET | ORAL | Status: AC | PRN

## 2016-02-07 MED ORDER — KETOROLAC TROMETHAMINE 60 MG/2ML IM SOLN
60.0000 mg | Freq: Once | INTRAMUSCULAR | Status: AC
Start: 2016-02-07 — End: 2016-02-07
  Administered 2016-02-07: 60 mg via INTRAMUSCULAR
  Filled 2016-02-07: qty 2

## 2016-02-07 NOTE — ED Provider Notes (Signed)
CSN: [id number]     Arrival date & time 02/07/16  0021 History   First MD Initiated Contact with Patient 02/07/16 0043     Chief Complaint  Patient presents with  . Back Pain     (Consider location/radiation/quality/duration/timing/severity/associated sxs/prior Treatment) The history is provided by the patient.   Riley Franco is a 60 y.o. male presenting with a 10 day history of low back pain which started out as a mid lumbar pain  when on vacation and he felt it was simply from sleeping on a different mattress.  It has progressed to now radiating sharp, stabbing pain down his left posterior thigh which developed tonight, causing him to fall when he stood tonight due to the intensity of pain (not weakness). He denies any injuries and denies weakness or numbness in his legs.  Denies saddle anesthesia.  He also denies urinary or fecal incontinence or retention.  He was seen by his pcp yesterday and started on flexeril and diclofenac which has not improved his symptoms.      History reviewed. No pertinent past medical history. Past Surgical History  Procedure Laterality Date  . Biceps tendon repair    . Anterior cruciate ligament repair     No family history on file. Social History  Substance Use Topics  . Smoking status: Never Smoker   . Smokeless tobacco: None  . Alcohol Use: Yes    Review of Systems  Constitutional: Negative for fever.  Respiratory: Negative for shortness of breath.   Cardiovascular: Negative for chest pain and leg swelling.  Gastrointestinal: Negative for abdominal pain, constipation and abdominal distention.  Genitourinary: Negative for dysuria, urgency, frequency, flank pain and difficulty urinating.  Musculoskeletal: Positive for back pain. Negative for joint swelling and gait problem.  Skin: Negative for rash.  Neurological: Negative for weakness and numbness.      Allergies  Bee venom and Penicillins  Home Medications   Prior to Admission  medications   Medication Sig Start Date End Date Taking? Authorizing Provider  cyclobenzaprine (FLEXERIL) 10 MG tablet Take 10 mg by mouth 3 (three) times daily as needed for muscle spasms.   Yes Historical Provider, MD  diclofenac (VOLTAREN) 75 MG EC tablet Take 75 mg by mouth 2 (two) times daily.   Yes Historical Provider, MD   BP 131/75 mmHg  Pulse 67  Temp(Src) 97.9 F (36.6 C) (Oral)  Resp 20  Ht 5\' 10"  (1.778 m)  Wt 108.863 kg  BMI 34.44 kg/m2  SpO2 98% Physical Exam  Constitutional: He appears well-developed and well-nourished.  HENT:  Head: Normocephalic.  Eyes: Conjunctivae are normal.  Neck: Normal range of motion. Neck supple.  Cardiovascular: Normal rate and intact distal pulses.   Pedal pulses normal.  Pulmonary/Chest: Effort normal.  Abdominal: Soft. Bowel sounds are normal. He exhibits no distension and no mass.  Musculoskeletal: Normal range of motion. He exhibits no edema.       Lumbar back: He exhibits tenderness and bony tenderness. He exhibits no swelling, no edema and no spasm.  Midlumbar and left paralumbar ttp.  Neurological: He is alert. He has normal strength. He displays no atrophy and no tremor. No sensory deficit. Gait normal.  Reflex Scores:      Patellar reflexes are 2+ on the right side and 2+ on the left side. No strength deficit noted in hip and knee flexor and extensor muscle groups.  Ankle flexion and extension intact with full strength.  Positive SLR right.  Skin: Skin  is warm and dry.  Psychiatric: He has a normal mood and affect.  Nursing note and vitals reviewed.   ED Course  Procedures (including critical care time) Labs Review Labs Reviewed - No data to display  Imaging Review No results found. I have personally reviewed and evaluated these images and lab results as part of my medical decision-making.   EKG Interpretation None      MDM   Final diagnoses:  Sciatica of left side    Pt with new onset low back pain with left  sided sciatica, who has not responded to the diclofenac and flexeril prescribed by pcp ytd.  Given hydrocodone tablet and toradol 60 mg IM.  Pending lumbar films.  disucssed with Dr. Preston Fleeting who agrees to follow pt.    Burgess Amor, PA-C 02/07/16 0141  Dione Booze, MD 02/07/16 [phone]

## 2016-02-07 NOTE — ED Notes (Signed)
Pt c/o lower back pain that started while on vacation on Jan 28, 2016, was seen by pcp yesterday, prescribed diclofenac and cyclobenzaprine, pt states that the medications helped yesterday but the pain has gotten worse today and when he went to stand up tonight he fell, pt states that the pain starts center of back area and radiates down left leg, no problems with bowel or urination,

## 2016-02-07 NOTE — Discharge Instructions (Signed)
Sciatica °Sciatica is pain, weakness, numbness, or tingling along the path of the sciatic nerve. The nerve starts in the lower back and runs down the back of each leg. The nerve controls the muscles in the lower leg and in the back of the knee, while also providing sensation to the back of the thigh, lower leg, and the sole of your foot. Sciatica is a symptom of another medical condition. For instance, nerve damage or certain conditions, such as a herniated disk or bone spur on the spine, pinch or put pressure on the sciatic nerve. This causes the pain, weakness, or other sensations normally associated with sciatica. Generally, sciatica only affects one side of the body. °CAUSES  °· Herniated or slipped disc. °· Degenerative disk disease. °· A pain disorder involving the narrow muscle in the buttocks (piriformis syndrome). °· Pelvic injury or fracture. °· Pregnancy. °· Tumor (rare). °SYMPTOMS  °Symptoms can vary from mild to very severe. The symptoms usually travel from the low back to the buttocks and down the back of the leg. Symptoms can include: °· Mild tingling or dull aches in the lower back, leg, or hip. °· Numbness in the back of the calf or sole of the foot. °· Burning sensations in the lower back, leg, or hip. °· Sharp pains in the lower back, leg, or hip. °· Leg weakness. °· Severe back pain inhibiting movement. °These symptoms may get worse with coughing, sneezing, laughing, or prolonged sitting or standing. Also, being overweight may worsen symptoms. °DIAGNOSIS  °Your caregiver will perform a physical exam to look for common symptoms of sciatica. He or she may ask you to do certain movements or activities that would trigger sciatic nerve pain. Other tests may be performed to find the cause of the sciatica. These may include: °· Blood tests. °· X-rays. °· Imaging tests, such as an MRI or CT scan. °TREATMENT  °Treatment is directed at the cause of the sciatic pain. Sometimes, treatment is not necessary  and the pain and discomfort goes away on its own. If treatment is needed, your caregiver may suggest: °· Over-the-counter medicines to relieve pain. °· Prescription medicines, such as anti-inflammatory medicine, muscle relaxants, or narcotics. °· Applying heat or ice to the painful area. °· Steroid injections to lessen pain, irritation, and inflammation around the nerve. °· Reducing activity during periods of pain. °· Exercising and stretching to strengthen your abdomen and improve flexibility of your spine. Your caregiver may suggest losing weight if the extra weight makes the back pain worse. °· Physical therapy. °· Surgery to eliminate what is pressing or pinching the nerve, such as a bone spur or part of a herniated disk. °HOME CARE INSTRUCTIONS  °· Only take over-the-counter or prescription medicines for pain or discomfort as directed by your caregiver. °· Apply ice to the affected area for 20 minutes, 3-4 times a day for the first 48-72 hours. Then try heat in the same way. °· Exercise, stretch, or perform your usual activities if these do not aggravate your pain. °· Attend physical therapy sessions as directed by your caregiver. °· Keep all follow-up appointments as directed by your caregiver. °· Do not wear high heels or shoes that do not provide proper support. °· Check your mattress to see if it is too soft. A firm mattress may lessen your pain and discomfort. °SEEK IMMEDIATE MEDICAL CARE IF:  °· You lose control of your bowel or bladder (incontinence). °· You have increasing weakness in the lower back, pelvis, buttocks,   or legs.  You have redness or swelling of your back.  You have a burning sensation when you urinate.  You have pain that gets worse when you lie down or awakens you at night.  Your pain is worse than you have experienced in the past.  Your pain is lasting longer than 4 weeks.  You are suddenly losing weight without reason. MAKE SURE YOU:  Understand these  instructions.  Will watch your condition.  Will get help right away if you are not doing well or get worse.   This information is not intended to replace advice given to you by your health care provider. Make sure you discuss any questions you have with your health care provider.   Document Released: 08/29/2001 Document Revised: 05/26/2015 Document Reviewed: 01/14/2012 Elsevier Interactive Patient Education [employer]2016 [employer]   Continue taking your diclofenac and flexeril prescribed by your doctor.  You may take the hydrocodone prescribed tonight for pain relief.  This will make you drowsy - do not drive within 4 hours of taking this medication.   Avoid lifting,  Bending,  Twisting or any other activity that worsens your pain over the next week but stay as active as you comfortably can.  Apply a heating pad to your lower back and flank area for 20 minutes 3 times daily.   You should get rechecked if your symptoms are not better over the next 5 days,  Or you develop increased pain,  Weakness in your leg(s) or loss of bladder or bowel function - these are symptoms of a worsening condition.

## 2016-02-07 NOTE — ED Notes (Signed)
Patient given water at this time. Patient walked to restroom.

## 2018-07-23 ENCOUNTER — Other Ambulatory Visit: Payer: Self-pay | Admitting: Family Medicine

## 2018-07-23 DIAGNOSIS — Z87891 Personal history of nicotine dependence: Secondary | ICD-10-CM

## 2018-08-02 ENCOUNTER — Ambulatory Visit
Admission: RE | Admit: 2018-08-02 | Discharge: 2018-08-02 | Disposition: A | Discharge status: Home / Self Care | Payer: 59 | Source: Ambulatory Visit | Attending: Family Medicine | Admitting: Family Medicine

## 2018-08-02 DIAGNOSIS — Z87891 Personal history of nicotine dependence: Secondary | ICD-10-CM

## 2018-11-04 ENCOUNTER — Other Ambulatory Visit: Payer: Self-pay | Admitting: Family Medicine

## 2018-11-04 DIAGNOSIS — J988 Other specified respiratory disorders: Secondary | ICD-10-CM

## 2018-11-04 DIAGNOSIS — J398 Other specified diseases of upper respiratory tract: Secondary | ICD-10-CM

## 2018-11-08 ENCOUNTER — Other Ambulatory Visit: Payer: 59

## 2019-08-24 IMAGING — CT CT CHEST LUNG CANCER SCREENING LOW DOSE W/O CM
2 of 5 series · 14 of 40 positions shown, 17 images · non-contrast
Comparison: None.

CLINICAL DATA: 62-year-old asymptomatic male former smoker with 60
pack-year smoking history, quit smoking 5 years prior.

EXAM:
CT CHEST WITHOUT CONTRAST LOW-DOSE FOR LUNG CANCER SCREENING
TECHNIQUE: Multidetector CT imaging of the chest was performed following the
standard protocol without IV contrast.

[Series 4: lung 1.00 br44 cor · coronal · 0.57mm/px · 3 of 423 slices shown]
[im 85/423  lung]
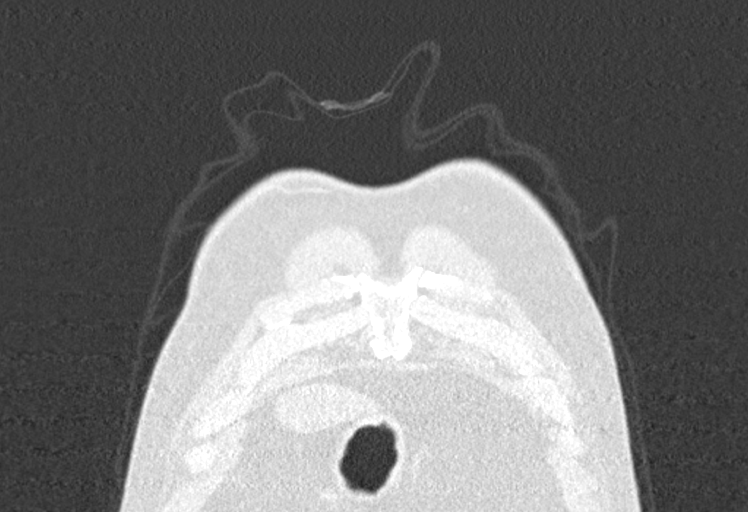
[im 169/423  lung]
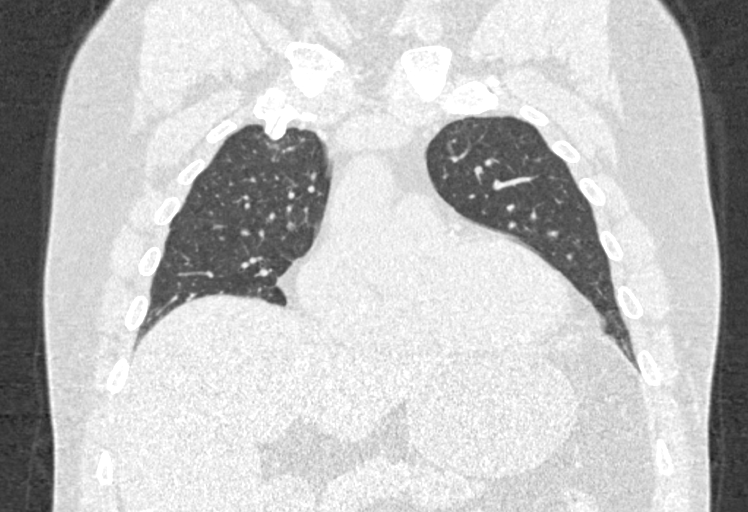
[im 254/423  lung]
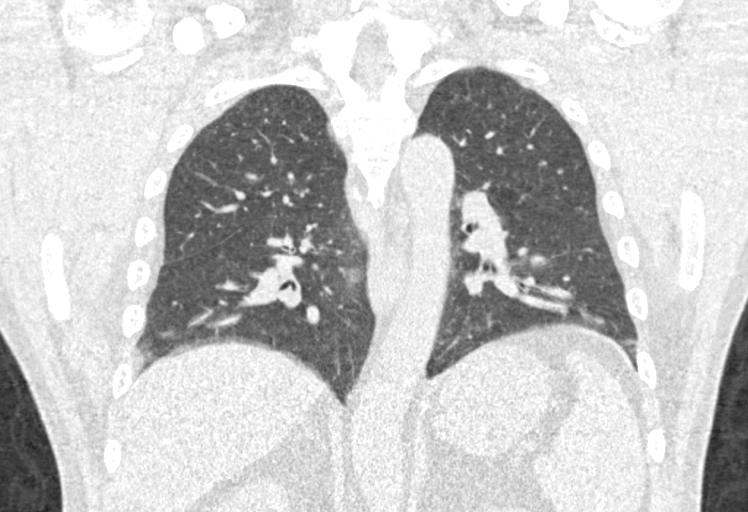

[Series 9: lung 1.00 br60 · axial · 0.83mm/px · z∈[-1032,-770]mm · 11 of 290 slices shown, 14 images]
[im 14/290  mediastinal]
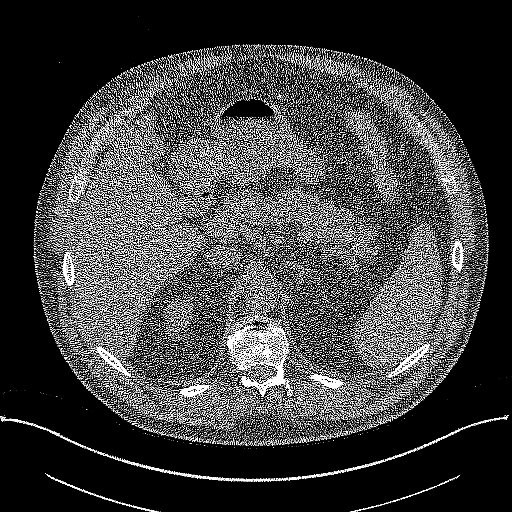
[im 14/290  lung]
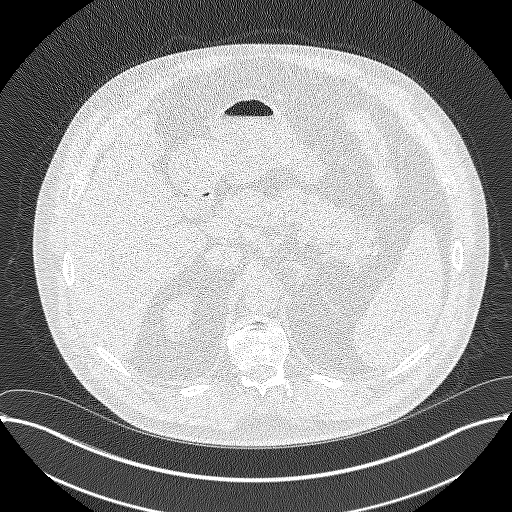
[im 40/290  lung]
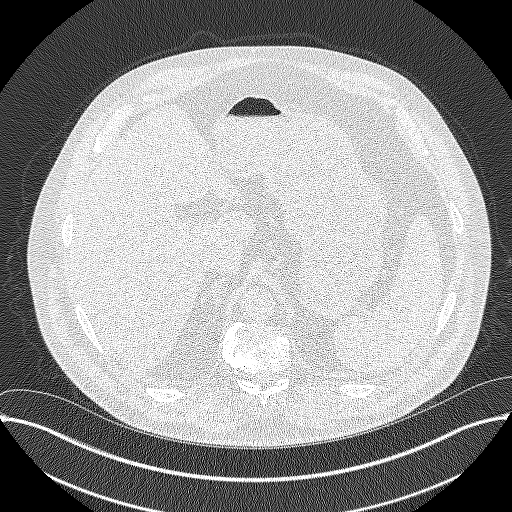
[im 66/290  lung]
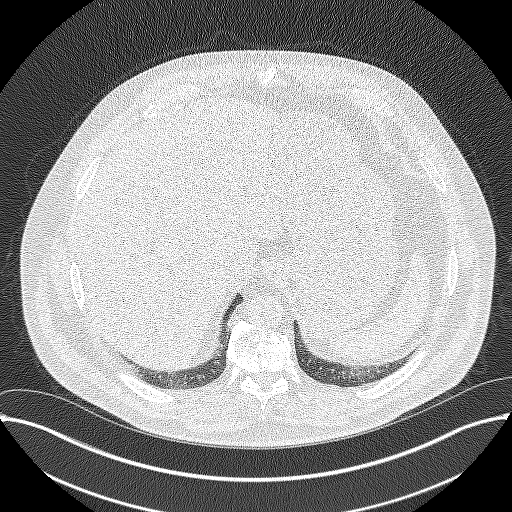
[im 92/290  lung]
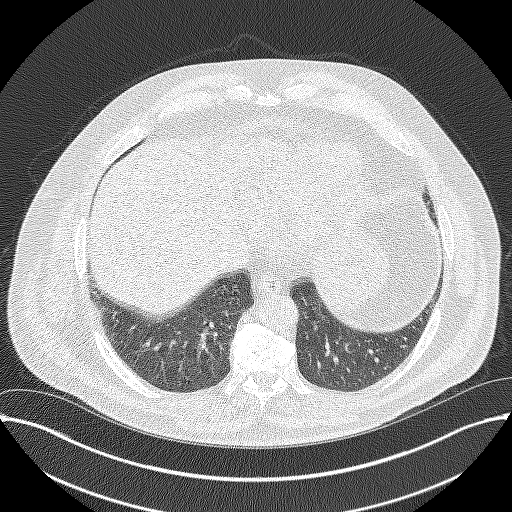
[im 119/290  mediastinal]
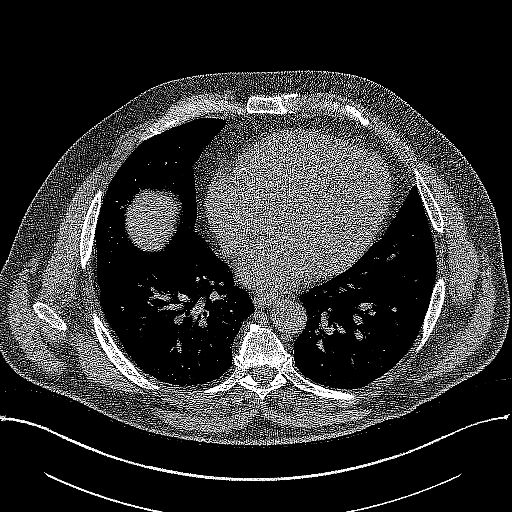
[im 119/290  lung]
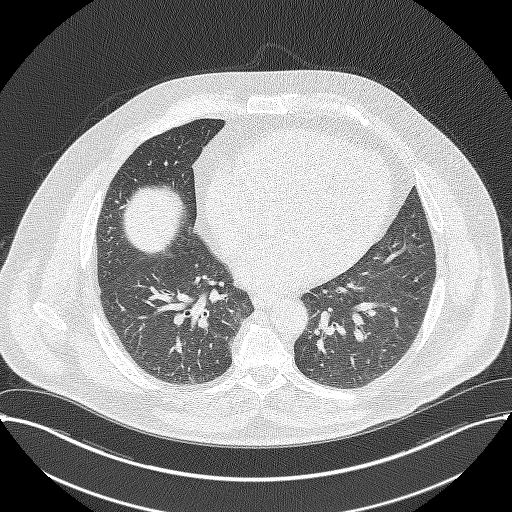
[im 145/290  lung]
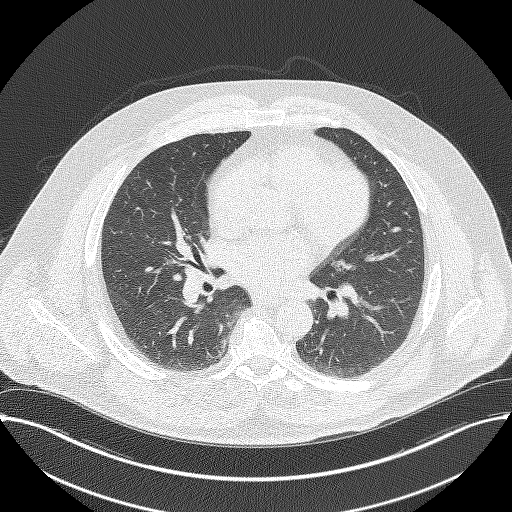
[im 171/290  lung]
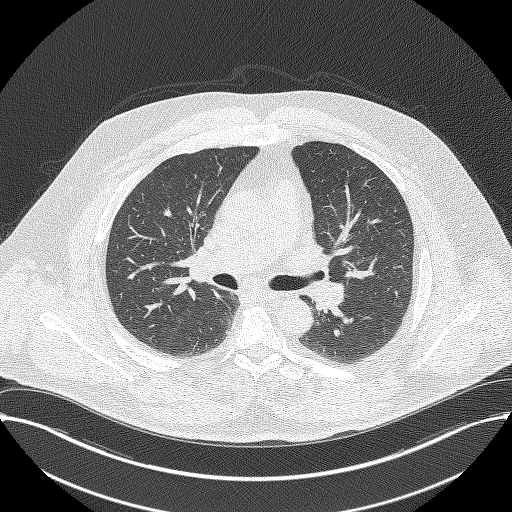
[im 198/290  lung]
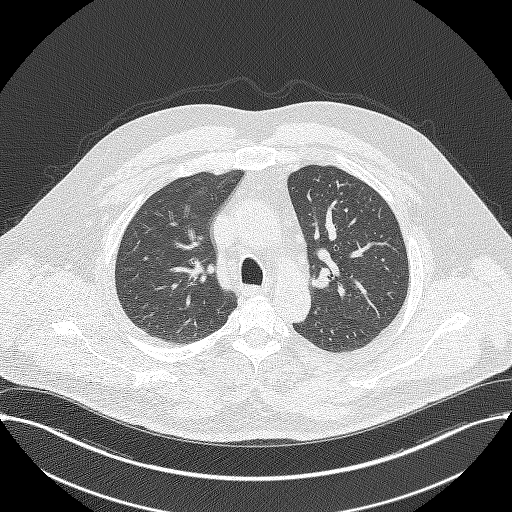
[im 224/290  mediastinal]
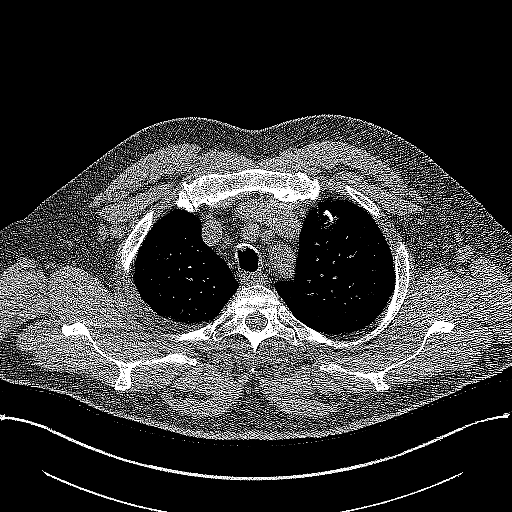
[im 224/290  lung]
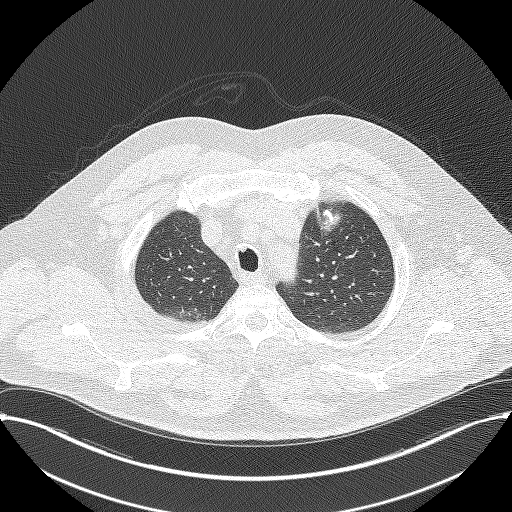
[im 250/290  lung]
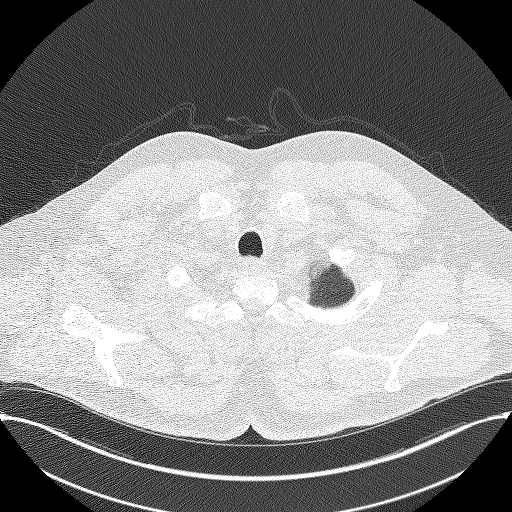
[im 276/290  lung]
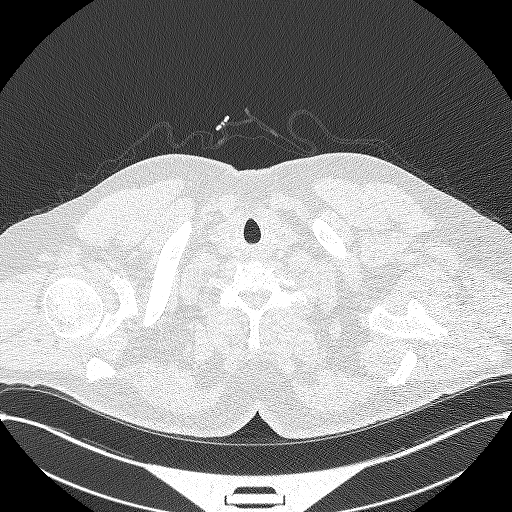

[14 of 40 positions shown; findings below may reference images not displayed]

FINDINGS: Cardiovascular: Top-normal heart size. No significant pericardial
effusion/thickening. Three-vessel coronary atherosclerosis.
Atherosclerotic nonaneurysmal thoracic aorta. Normal caliber
pulmonary arteries.

Mediastinum/Nodes: No discrete thyroid nodules. Unremarkable
esophagus. Symmetric top-normal size axillary lymph nodes
bilaterally. No pathologically enlarged mediastinal or discrete
hilar nodes on this noncontrast scan.

Lungs/Pleura: No pneumothorax. No pleural effusion. Mild
centrilobular emphysema with mild diffuse bronchial wall thickening.
No acute consolidative airspace disease or lung masses. Anterior
right upper lobe solid pulmonary nodule measuring 3.2 mm in volume
derived mean diameter (series 9/image 122). No additional
significant pulmonary nodules. Anterior tracheal 6 mm mural nodule
(series 9/image 68).

Upper abdomen: No acute abnormality.

Musculoskeletal: No aggressive appearing focal osseous lesions.
Marked thoracic spondylosis.
IMPRESSION: 1. Lung-RADS 2-S, benign appearance or behavior. Continue annual
screening with low-dose chest CT without contrast in 12 months.
2. Small 6 mm mural nodule in the non-dependent trachea,
indeterminate, most commonly due to adherent mucoid material,
although a tracheal neoplasm cannot be entirely excluded. Suggest a
follow-up diagnostic chest CT in 3 months. Please ask the patient to
cough vigorously immediately prior to this follow-up chest CT.
3. Three-vessel coronary atherosclerosis.

Aortic Atherosclerosis (MHY5B-O9F.F) and Emphysema ([id number].M).

These results will be called to the ordering clinician or
representative by the Radiologist Assistant, and communication
documented in the PACS or zVision Dashboard.

## 2019-10-06 ENCOUNTER — Emergency Department (HOSPITAL_COMMUNITY): Payer: BC Managed Care – PPO

## 2019-10-06 ENCOUNTER — Other Ambulatory Visit: Payer: Self-pay

## 2019-10-06 ENCOUNTER — Encounter (HOSPITAL_COMMUNITY): Payer: Self-pay | Admitting: Emergency Medicine

## 2019-10-06 ENCOUNTER — Emergency Department (HOSPITAL_COMMUNITY)
Admission: EM | Admit: 2019-10-06 | Discharge: 2019-10-06 | Disposition: A | Discharge status: Home / Self Care | Payer: BC Managed Care – PPO | Attending: Emergency Medicine | Admitting: Emergency Medicine

## 2019-10-06 DIAGNOSIS — R05 Cough: Secondary | ICD-10-CM | POA: Diagnosis present

## 2019-10-06 DIAGNOSIS — U071 COVID-19: Secondary | ICD-10-CM | POA: Insufficient documentation

## 2019-10-06 DIAGNOSIS — Z79899 Other long term (current) drug therapy: Secondary | ICD-10-CM | POA: Insufficient documentation

## 2019-10-06 DIAGNOSIS — R059 Cough, unspecified: Secondary | ICD-10-CM

## 2019-10-06 MED ORDER — BENZONATATE 100 MG PO CAPS: 100.0000 mg | ORAL_CAPSULE | Freq: Three times a day (TID) | ORAL | 0 refills | Status: AC

## 2019-10-06 NOTE — ED Provider Notes (Signed)
[facility] EMERGENCY DEPARTMENT Provider Note   CSN: [id number] Arrival date & time: 10/06/19  0932     History Chief Complaint  Patient presents with  . Cough    Riley Franco is a 64 y.o. male who presents to the ED today with continued productive cough x 2-3 days.  Patient reports that he was diagnosed with COVID-19 on 1/11 after having 2 days of body aches and chills shortness of breath.  Patient states that he has been taking prednisone as well as albuterol inhaler with mild relief although reports he began coughing about 2 to 3 days ago and it is keeping him up at night.  Patient states when he lays down and tries to go to sleep he will continue to cough and feel like he is "choking" is causing him concerned.  She also states that he feels cold all over however he has not been checking his temperature as he does not have a thermometer and has not been taking anything for fevers.  Patient states he was told that after about 10 days he would start feeling better but he is only feeling worse prompting him to come to the ED today.  He is denying fevers, mopped assist, chest pain, abdominal pain, nausea, vomiting, diarrhea, headache, vision changes, any other associated symptoms.   The history is provided by the patient.       History reviewed. No pertinent past medical history.  Patient Active Problem List   Diagnosis Date Noted  . Biceps tendon rupture, proximal 04/22/2013  . Pain in joint, shoulder region 04/22/2013  . Pain in joint, upper arm 04/22/2013  . Muscle weakness (generalized) 04/22/2013    Past Surgical History:  Procedure Laterality Date  . ANTERIOR CRUCIATE LIGAMENT REPAIR    . BICEPS TENDON REPAIR         History reviewed. No pertinent family history.  Social History   Tobacco Use  . Smoking status: Never Smoker  Substance Use Topics  . Alcohol use: Yes  . Drug use: No    Home Medications Prior to Admission medications   Medication Sig Start  Date End Date Taking? Authorizing Provider  benzonatate (TESSALON) 100 MG capsule Take 1 capsule (100 mg total) by mouth every 8 (eight) hours. 10/06/19   Hyman Hopes, Raeleen Winstanley, PA-C  cyclobenzaprine (FLEXERIL) 10 MG tablet Take 10 mg by mouth 3 (three) times daily as needed for muscle spasms.    [provider]  diclofenac (VOLTAREN) 75 MG EC tablet Take 75 mg by mouth 2 (two) times daily.    [provider]  HYDROcodone-acetaminophen (NORCO/VICODIN) 5-325 MG tablet Take 1 tablet by mouth every 4 (four) hours as needed. 02/07/16   Burgess Amor, PA-C    Allergies    Bee venom and Penicillins  Review of Systems   Review of Systems  Constitutional: Positive for chills and fatigue. Negative for fever.  Respiratory: Positive for cough and shortness of breath.   Cardiovascular: Negative for chest pain.  Gastrointestinal: Negative for abdominal pain, diarrhea, nausea and vomiting.  All other systems reviewed and are negative.   Physical Exam Updated Vital Signs BP 137/69 (BP Location: Right Arm)   Pulse 78   Temp 98.4 F (36.9 C) (Oral)   Resp (!) 22   Ht 5\' 10"  (1.778 m)   Wt 108.9 kg   SpO2 99%   BMI 34.44 kg/m   Physical Exam Vitals and nursing note reviewed.  Constitutional:      Appearance: He  is not ill-appearing or diaphoretic.  HENT:     Head: Normocephalic and atraumatic.  Eyes:     Conjunctiva/sclera: Conjunctivae normal.  Cardiovascular:     Rate and Rhythm: Normal rate and regular rhythm.     Pulses: Normal pulses.  Pulmonary:     Effort: Pulmonary effort is normal.     Breath sounds: Normal breath sounds. No wheezing, rhonchi or rales.     Comments: Actively coughing in room however able to speak in full sentences without difficulty. No accessory muscle use. Satting 97% on RA.  Abdominal:     Palpations: Abdomen is soft.     Tenderness: There is no abdominal tenderness.  Musculoskeletal:     Cervical back: Neck supple.  Skin:    General: Skin is  warm and dry.  Neurological:     Mental Status: He is alert.     ED Results / Procedures / Treatments   Labs (all labs ordered are listed, but only abnormal results are displayed) Labs Reviewed - No data to display  EKG None  Radiology DG Chest [facility] 1 View  Result Date: 10/06/2019 CLINICAL DATA:  COVID-19 diagnosis last Monday.  Chills. EXAM: PORTABLE CHEST 1 VIEW COMPARISON:  None FINDINGS: The heart, hila, and mediastinum are normal. Subtle opacities are suspected in the periphery of the left lung. No definite infiltrate on the right. No pneumothorax. No other acute abnormalities. IMPRESSION: Subtle infiltrate suspected in the periphery of the left lung, consistent with the patient's COVID-19 status. No other acute abnormalities are identified. Electronically Signed   By: Dorise Bullion III M.D   On: 10/06/2019 09:58    Procedures Procedures (including critical care time)  Medications Ordered in ED Medications - No data to display  ED Course  I have reviewed the triage vital signs and the nursing notes.  Pertinent labs & imaging results that were available during my care of the patient were reviewed by me and considered in my medical decision making (see chart for details).  64 year old male who is Covid positive, diagnosed on 1/11 who presents for persistent symptoms.  States he began coughing 2 to 3 days ago and this is keeping him up at night. Personally visualized pt ambulating from waiting room without difficulty.   On arrival to the ED patient is afebrile, nontachycardic, nontachypneic.  He is satting 99% on room air.  He is able to speak in full sentences without difficulty, actively coughing in the room.  Does not appear to be in any acute distress on my exam today.  Will obtain EKG, portable chest x-ray, ambulate to ensure pulse ox does not desaturate.  I lengthy discussion with patient regarding the fact that he may continue to have a cough which may keep him up at  night.  I will prescribe Tessalon Perles for him to take.  Anticipate discharge home with close PCP follow-up.  Unfortunately given patient is approximately 12 days out he does not qualify for monoclonal antibody infusion today.   Pt ambulated in the ED with O2 sats remaining at 94-96%. Pt stable for discharge home.   This note was prepared using Dragon voice recognition software and may include unintentional dictation errors due to the inherent limitations of voice recognition software.  Hill AVELARDO REESMAN was evaluated in Emergency Department on 10/06/2019 for the symptoms described in the history of present illness. He was evaluated in the context of the global COVID-19 pandemic, which necessitated consideration that the patient might be at risk for  infection with the SARS-CoV-2 virus that causes COVID-19. Institutional protocols and algorithms that pertain to the evaluation of patients at risk for COVID-19 are in a state of rapid change based on information released by regulatory bodies including the CDC and federal and state organizations. These policies and algorithms were followed during the patient's care in the ED.     MDM Rules/Calculators/A&P                       Final Clinical Impression(s) / ED Diagnoses Final diagnoses:  Cough  COVID-19    Rx / DC Orders ED Discharge Orders         Ordered    benzonatate (TESSALON) 100 MG capsule  Every 8 hours     10/06/19 1040           Tanda Rockers, PA-C 10/06/19 1721    Pricilla Loveless, MD 10/07/19 [phone]

## 2019-10-06 NOTE — ED Triage Notes (Addendum)
Pt reports was diagnosed with covid last Monday and reports continued chills, intermittent dyspnea that is worse at night, difficulty laying flat, productive cough.

## 2019-10-06 NOTE — ED Notes (Signed)
Patient ambulated on RA. O2 sats maintained 94-96%, no c/o SOB while ambulating

## 2019-10-06 NOTE — Discharge Instructions (Addendum)
Please pick up medication and take as prescribed for cough. Continue taking your prednisone until you are finished with the pack. Use your albuterol inhaler as needed. Sleep with 2-3 pillows or in a recliner if you feel your coughing is worse at nighttime while you try to lay flat. Continue quarantining at home as specified when you were tested. Please follow up with your PCP.

## 2019-10-06 NOTE — ED Notes (Signed)
Patient ambulated on RA. O2 sats maintained 94-96% no c/o SOB while ambulating.

## 2020-10-27 IMAGING — DX DG CHEST 1V PORT
1 series · 1 of 1 positions shown · non-contrast
Comparison: None

CLINICAL DATA: G200Y-PR diagnosis last [REDACTED].  Chills.

EXAM:
PORTABLE CHEST 1 VIEW

[chest ap]
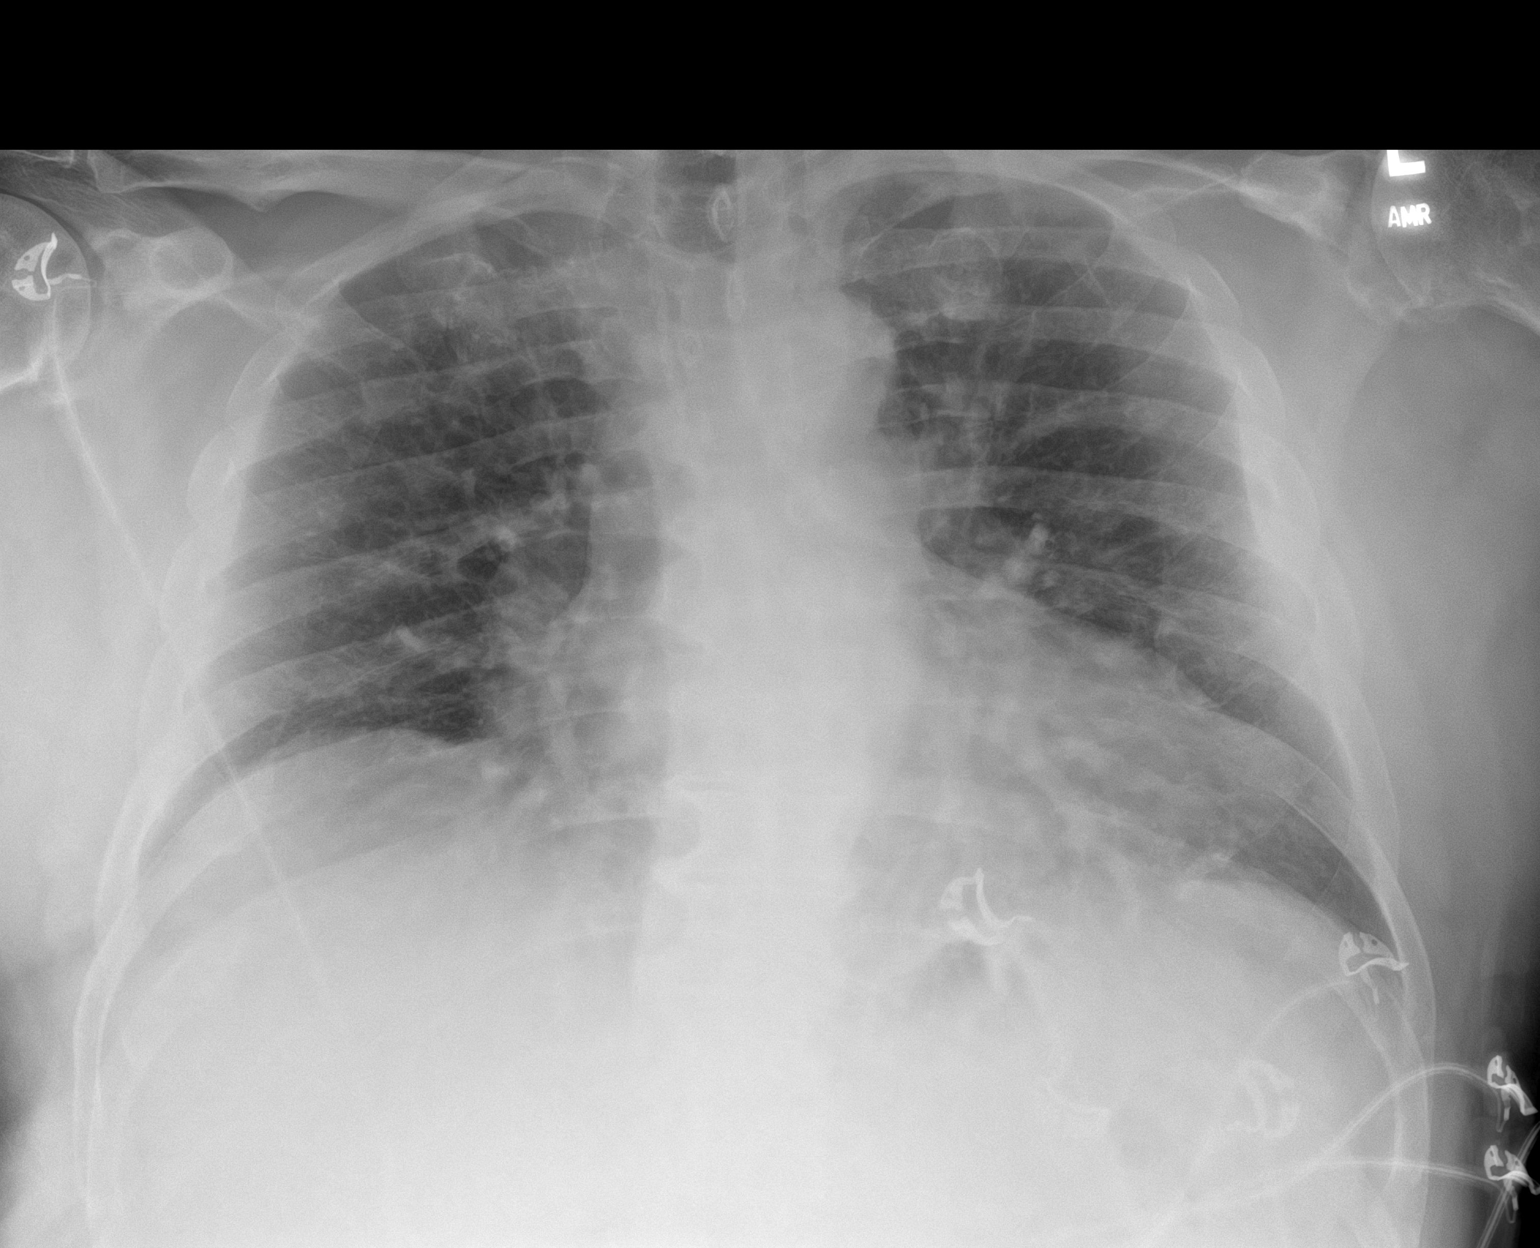

[1 of 1 positions shown; findings below may reference images not displayed]

FINDINGS: The heart, hila, and mediastinum are normal. Subtle opacities are
suspected in the periphery of the left lung. No definite infiltrate
on the right. No pneumothorax. No other acute abnormalities.
IMPRESSION: Subtle infiltrate suspected in the periphery of the left lung,
consistent with the patient's G200Y-PR status. No other acute
abnormalities are identified.

## 2023-10-28 ENCOUNTER — Emergency Department (HOSPITAL_COMMUNITY): Payer: No Typology Code available for payment source

## 2023-10-28 ENCOUNTER — Other Ambulatory Visit: Payer: Self-pay

## 2023-10-28 ENCOUNTER — Encounter (HOSPITAL_COMMUNITY): Payer: Self-pay | Admitting: Emergency Medicine

## 2023-10-28 ENCOUNTER — Emergency Department (HOSPITAL_COMMUNITY)
Admission: EM | Admit: 2023-10-28 | Discharge: 2023-10-28 | Disposition: A | Discharge status: Home / Self Care | Payer: No Typology Code available for payment source | Attending: Emergency Medicine | Admitting: Emergency Medicine

## 2023-10-28 DIAGNOSIS — I159 Secondary hypertension, unspecified: Secondary | ICD-10-CM | POA: Insufficient documentation

## 2023-10-28 DIAGNOSIS — R519 Headache, unspecified: Secondary | ICD-10-CM | POA: Diagnosis present

## 2023-10-28 LAB — CBC WITH DIFFERENTIAL/PLATELET
Abs Immature Granulocytes: 0.02 10*3/uL (ref 0.00–0.07)
Basophils Absolute: 0 10*3/uL (ref 0.0–0.1)
Basophils Relative: 1 %
Eosinophils Absolute: 0.1 10*3/uL (ref 0.0–0.5)
Eosinophils Relative: 1 %
HCT: 44.7 % (ref 39.0–52.0)
Hemoglobin: 15.6 g/dL (ref 13.0–17.0)
Immature Granulocytes: 0 %
Lymphocytes Relative: 21 %
Lymphs Abs: 1.6 10*3/uL (ref 0.7–4.0)
MCH: 31.3 pg (ref 26.0–34.0)
MCHC: 34.9 g/dL (ref 30.0–36.0)
MCV: 89.8 fL (ref 80.0–100.0)
Monocytes Absolute: 0.8 10*3/uL (ref 0.1–1.0)
Monocytes Relative: 10 %
Neutro Abs: 5.2 10*3/uL (ref 1.7–7.7)
Neutrophils Relative %: 67 %
Platelets: 260 10*3/uL (ref 150–400)
RBC: 4.98 MIL/uL (ref 4.22–5.81)
RDW: 12 % (ref 11.5–15.5)
WBC: 7.7 10*3/uL (ref 4.0–10.5)
nRBC: 0 % (ref 0.0–0.2)

## 2023-10-28 LAB — BASIC METABOLIC PANEL
Anion gap: 12 (ref 5–15)
BUN: 11 mg/dL (ref 8–23)
CO2: 26 mmol/L (ref 22–32)
Calcium: 9.5 mg/dL (ref 8.9–10.3)
Chloride: 100 mmol/L (ref 98–111)
Creatinine, Ser: 0.71 mg/dL (ref 0.61–1.24)
GFR, Estimated: >60 mL/min (ref >60)
Glucose, Bld: 118 mg/dL — ABNORMAL HIGH (ref 70–99)
Potassium: 3.9 mmol/L (ref 3.5–5.1)
Sodium: 138 mmol/L (ref 135–145)

## 2023-10-28 MED ORDER — ACETAMINOPHEN 500 MG PO TABS
1000.0000 mg | ORAL_TABLET | Freq: Once | ORAL | Status: AC
  Administered 2023-10-28: 1000 mg via ORAL
  Filled 2023-10-28: qty 2

## 2023-10-28 MED ORDER — IOHEXOL 350 MG/ML SOLN
75.0000 mL | Freq: Once | INTRAVENOUS | Status: AC | PRN
  Administered 2023-10-28: 75 mL via INTRAVENOUS

## 2023-10-28 NOTE — ED Provider Notes (Signed)
 [place] EMERGENCY DEPARTMENT AT [facility] Provider Note   CSN: [id number] Arrival date & time: 10/28/23  1255     History  Chief Complaint  Patient presents with   Headache   Hypertension    Riley Franco is a 68 y.o. male.  The history is provided by the patient and medical records. No language interpreter was used.  Headache Hypertension Associated symptoms include headaches.     68 year old male presenting with concerns of elevated blood pressure.  Patient states he has had ongoing headache for the past 5 to 6 weeks.  States headaches was described more as a pressure behind his eyes bilaterally.  He was seen at the [facility] clinic 2 weeks ago for his complaint.  He was prescribed medication to treat for sinusitis which include doxycycline, guaifenesin, and pseudoephedrine.  He did notice an improvement of his symptoms however this morning he felt pain to the right side of his head and right side of his neck that returns.  Pain is sharp stabbing and when he checks his blood pressure he noticed that it was elevated at 183/80.  This concerns and prompted this ER visit.  He does not not endorse any fever, vision changes, sinus drainage, cough, neck stiffness, focal numbness or focal weakness pain in his chest.  He did mention he had chiropractic manipulation of his neck 2 days prior which he has had performed in the past.  Home Medications Prior to Admission medications   Medication Sig Start Date End Date Taking? Authorizing Provider  benzonatate  (TESSALON ) 100 MG capsule Take 1 capsule (100 mg total) by mouth every 8 (eight) hours. 10/06/19   Shepard, Margaux, PA-C  cyclobenzaprine (FLEXERIL) 10 MG tablet Take 10 mg by mouth 3 (three) times daily as needed for muscle spasms.    [provider]  diclofenac (VOLTAREN) 75 MG EC tablet Take 75 mg by mouth 2 (two) times daily.    [provider]  HYDROcodone -acetaminophen  (NORCO/VICODIN) 5-325 MG tablet Take 1  tablet by mouth every 4 (four) hours as needed. 02/07/16   Idol, Julie, PA-C      Allergies    Bee venom and Penicillins    Review of Systems   Review of Systems  Neurological:  Positive for headaches.  All other systems reviewed and are negative.   Physical Exam Updated Vital Signs BP (!) 166/95 (BP Location: Right Arm)   Pulse 75   Temp 97.9 F (36.6 C) (Oral)   Resp 16   Ht 5' 10 (1.778 m)   Wt 108 kg   SpO2 92%   BMI 34.16 kg/m  Physical Exam Vitals and nursing note reviewed.  Constitutional:      General: He is not in acute distress.    Appearance: He is well-developed.  HENT:     Head: Normocephalic and atraumatic.     Mouth/Throat:     Mouth: Mucous membranes are moist.  Eyes:     General: No visual field deficit.    Extraocular Movements: Extraocular movements intact.     Conjunctiva/sclera: Conjunctivae normal.     Pupils: Pupils are equal, round, and reactive to light.  Neck:     Meningeal: Brudzinski's sign and Kernig's sign absent.  Cardiovascular:     Rate and Rhythm: Normal rate and regular rhythm.     Heart sounds: Normal heart sounds.  Pulmonary:     Effort: Pulmonary effort is normal.     Breath sounds: Normal breath sounds. No wheezing,  rhonchi or rales.  Abdominal:     Palpations: Abdomen is soft.  Musculoskeletal:        General: Normal range of motion.     Cervical back: Normal range of motion and neck supple. No rigidity.  Lymphadenopathy:     Cervical: No cervical adenopathy.  Skin:    General: Skin is warm.     Findings: No rash.  Neurological:     Mental Status: He is alert.     GCS: GCS eye subscore is 4. GCS verbal subscore is 5. GCS motor subscore is 6.     Cranial Nerves: No cranial nerve deficit, dysarthria or facial asymmetry.     Sensory: No sensory deficit.     Motor: No weakness.     Coordination: Romberg sign negative. Coordination normal.     Gait: Gait normal.     Deep Tendon Reflexes: Reflexes normal.  Psychiatric:         Mood and Affect: Mood normal.     ED Results / Procedures / Treatments   Labs (all labs ordered are listed, but only abnormal results are displayed) Labs Reviewed  BASIC METABOLIC PANEL - Abnormal; Notable for the following components:      Result Value   Glucose, Bld 118 (*)    All other components within normal limits  CBC WITH DIFFERENTIAL/PLATELET    EKG None  Radiology CT ANGIO HEAD NECK W WO CM Result Date: 10/28/2023 CLINICAL DATA:  Neck trauma. Right-sided headache and neck pain following chiropractic manipulation. EXAM: CT ANGIOGRAPHY HEAD AND NECK WITH AND WITHOUT CONTRAST TECHNIQUE: Multidetector CT imaging of the head and neck was performed using the standard protocol during bolus administration of intravenous contrast. Multiplanar CT image reconstructions and MIPs were obtained to evaluate the vascular anatomy. Carotid stenosis measurements (when applicable) are obtained utilizing NASCET criteria, using the distal internal carotid diameter as the denominator. RADIATION DOSE REDUCTION: This exam was performed according to the departmental dose-optimization program which includes automated exposure control, adjustment of the mA and/or kV according to patient size and/or use of iterative reconstruction technique. CONTRAST:  75mL OMNIPAQUE  IOHEXOL  350 MG/ML SOLN COMPARISON:  None Available. FINDINGS: CT HEAD FINDINGS Brain: No acute infarct, hemorrhage, or mass lesion is present. No significant white matter lesions are present. Deep brain nuclei are within normal limits. The ventricles are of normal size. No significant extraaxial fluid collection is present. The brainstem and cerebellum are within normal limits. A relatively empty sella is present. Midline structures are otherwise within normal limits. Vascular: No hyperdense vessel or unexpected calcification. Skull: Calvarium is intact. No focal lytic or blastic lesions are present. No significant extracranial soft tissue  lesion is present. Sinuses/Orbits: The paranasal sinuses and mastoid air cells are clear. Bilateral lens replacements are noted. Globes and orbits are otherwise unremarkable. Review of the MIP images confirms the above findings CTA NECK FINDINGS Aortic arch: Atherosclerotic calcifications are present at the aortic arch. Three vessel arch configuration is present. Great vessel origins are within normal limits. No focal stenosis or aneurysm is present. No dissection is present at the aortic arch. Right carotid system: The right common carotid artery is within normal limits. Minimal atherosclerotic changes are present at bifurcation without significant stenosis relative to the more distal ICA. The cervical right ICA is otherwise normal. Left carotid system: The left common carotid artery is within normal limits. The bifurcation is unremarkable. The cervical left ICA is normal. Vertebral arteries: The vertebral arteries are codominant. Both vertebral arteries originate  from the subclavian arteries without significant stenosis. No significant stenosis is present in either vertebral artery in the neck. Skeleton: The vertebral body heights are normal. Slight degenerative anterolisthesis is present at C4-5. Uncovertebral spurring and foraminal narrowing is greatest at C5-6 and C6-7 on the right. Facet spurring and foraminal narrowing is greatest at C3-4 and C4-5 on the left. Other neck: Soft tissues the neck are otherwise unremarkable. Salivary glands are within normal limits. Thyroid is normal. No significant adenopathy is present. No focal mucosal or submucosal lesions are present. Upper chest: The lung apices are clear. The thoracic inlet is within normal limits. Review of the MIP images confirms the above findings CTA HEAD FINDINGS Anterior circulation: Atherosclerotic calcifications are present within the cavernous internal carotid arteries without significant stenosis through the ICA termini bilaterally. The A1 and M1  segments are normal. The MCA bifurcations are intact bilaterally scratched at the MCA bifurcations are within normal limits bilaterally. The ACA and MCA branch vessels normal. No aneurysm is present. Posterior circulation: The PICA origins are visualized and normal. The vertebrobasilar junction basilar artery normal. The superior cerebellar arteries are patent. The left posterior cerebral artery originates from basilar tip. The right posterior cerebral artery is of fetal type. Mild narrowing is present in the right P2 segment. The PCA branch vessels are otherwise within normal limits bilaterally. Venous sinuses: The dural sinuses are patent. The straight sinus and deep cerebral veins are intact. Cortical veins are within normal limits. No significant vascular malformation is evident. Anatomic variants: Fetal type right posterior cerebral artery. Review of the MIP images confirms the above findings IMPRESSION: 1. No acute intracranial abnormality or significant white matter disease. 2. Normal CT of the neck.  No acute or focal vascular injury. 3. No significant stenosis, aneurysm, or branch vessel occlusion within the Circle of Willis. 4. Relatively empty sella. This is nonspecific, but can be seen in the setting of idiopathic intracranial hypertension. 5. Minimal atherosclerotic changes at the right carotid bifurcation without significant stenosis relative to the more distal ICA. 6. Fetal type right posterior cerebral artery. 7. 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 8.  Aortic Atherosclerosis (ICD10-I70.0). Electronically Signed   By: Lonni Necessary M.D.   On: 10/28/2023 16:05    Procedures Procedures    Medications Ordered in ED Medications  acetaminophen  (TYLENOL ) tablet 1,000 mg (has no administration in time range)  iohexol  (OMNIPAQUE ) 350 MG/ML injection 75 mL (75 mLs Intravenous Contrast Given 10/28/23 1529)    ED Course/ Medical Decision Making/ A&P                                  Medical Decision Making Amount and/or Complexity of Data Reviewed Labs: ordered. Radiology: ordered.  Risk Prescription drug management.   BP (!) 166/95 (BP Location: Right Arm)   Pulse 75   Temp 97.9 F (36.6 C) (Oral)   Resp 16   Ht 5' 10 (1.778 m)   Wt 108 kg   SpO2 92%   BMI 34.16 kg/m   93:12 PM  68 year old male presenting with concerns of elevated blood pressure.  Patient states he has had ongoing headache for the past 5 to 6 weeks.  States headaches was described more as a pressure behind his eyes bilaterally.  He was seen at the [facility] clinic 2 weeks ago for his complaint.  He was prescribed medication to treat for sinusitis which include doxycycline, guaifenesin,  and pseudoephedrine.  He did notice an improvement of his symptoms however this morning he felt pain to the right side of his head and right side of his neck that returns.  Pain is sharp stabbing and when he checks his blood pressure he noticed that it was elevated at 183/80.  This concerns and prompted this ER visit.  He does not not endorse any fever, vision changes, sinus drainage, cough, neck stiffness, focal numbness or focal weakness pain in his chest.  He did mention he had chiropractic manipulation of his neck 2 days prior which he has had performed in the past.  Exam overall reassuring no focal neurodeficit patient does not have any appreciable carotid bruit or palpable mass on his neck.  No cervical midline spine tenderness.  -Labs ordered, independently viewed and interpreted by me.  Labs remarkable for reassuring lab values -The patient was maintained on a cardiac monitor.  I personally viewed and interpreted the cardiac monitored which showed an underlying rhythm of: NSR -Imaging independently viewed and interpreted by me and I agree with radiologist's interpretation.  Result remarkable for head/neck CTA without acute finding -This patient presents to the ED for concern of elevated BP, this  involves an extensive number of treatment options, and is a complaint that carries with it a high risk of complications and morbidity.  The differential diagnosis includes hypertension, medication induced HTN, stress, pain, dissection, hypertensive crisis -Co morbidities that complicate the patient evaluation includes none -Treatment includes tylenol  -Reevaluation of the patient after these medicines showed that the patient improved -PCP office notes or outside notes reviewed -Discussion with attending Dr. Towana -Escalation to admission/observation considered: patients feels much better, is comfortable with discharge, and will follow up with PCP -Prescription medication considered, patient comfortable with stopping his sinus medication including pseudoephedrine -Social Determinant of Health considered  CT scan of head and neck without any acute finding.  Incidentally there is a relative empty sella which is nonspecific but can be seen in the setting of idiopathic intracranial hypertension.  I believe this is an incidental finding and not likely to be the source of his elevated blood pressure or headache however I encouraged patient to follow-up closely with his neurologist and with his PCP for further evaluation.  Consider LP to check for pressure but when considering risk and benefit I felt the risks outweighed the benefit.  I encouraged patient to stop taking his sinus medication including pseudoephedrine.  Return precaution given.         Final Clinical Impression(s) / ED Diagnoses Final diagnoses:  Secondary hypertension    Rx / DC Orders ED Discharge Orders     None         Nivia Colon, PA-C 10/28/23 1659    Towana Ozell BROCKS, MD 10/28/23 1758

## 2023-10-28 NOTE — Discharge Instructions (Addendum)
 You have been evaluated for your symptoms.  Fortunately CT scan today did not show any acute finding.  Your elevated blood pressure may be due to taking sinus medication that can increase blood pressure such as pseudoephedrine.  Stop taking this medication and follow-up closely with your doctor for blood pressure recheck.  CT scan did document that you have an empty sella which is nonspecific but can be seen in the setting of idiopathic intracranial hypertension.  Discussed this with your primary care doctor or with neurologist for outpatient evaluation and management.

## 2023-10-28 NOTE — ED Triage Notes (Signed)
 Pt reports headaches for 5-6 weeks. Seen at [facility] 2 weeks ago and given sinus medication (doxycyline, guaifenesin, pseudoephedrine). Pt noticed BP elevated today 183/80.
